# Patient Record
Sex: Male | Born: 1990 | Race: Black or African American | Hispanic: No | Marital: Single | Smoking: Current some day smoker
Health system: Southern US, Community
[De-identification: ages and names within clinical notes are randomized; demographics above are authoritative.]

## PROBLEM LIST (undated history)

## (undated) DIAGNOSIS — F209 Schizophrenia, unspecified: Secondary | ICD-10-CM

## (undated) DIAGNOSIS — F319 Bipolar disorder, unspecified: Secondary | ICD-10-CM

## (undated) DIAGNOSIS — E559 Vitamin D deficiency, unspecified: Secondary | ICD-10-CM

## (undated) DIAGNOSIS — J309 Allergic rhinitis, unspecified: Secondary | ICD-10-CM

## (undated) HISTORY — DX: Allergic rhinitis, unspecified: J30.9

## (undated) HISTORY — DX: Vitamin D deficiency, unspecified: E55.9

## (undated) HISTORY — PX: NO PAST SURGERIES: SHX2092

---
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
## 2016-03-28 ENCOUNTER — Emergency Department (HOSPITAL_COMMUNITY)
Admission: EM | Admit: 2016-03-28 | Discharge: 2016-03-28 | Disposition: A | Discharge status: Home / Self Care | Payer: Self-pay | Attending: Emergency Medicine | Admitting: Emergency Medicine

## 2016-03-28 ENCOUNTER — Encounter (HOSPITAL_COMMUNITY): Payer: Self-pay | Admitting: *Deleted

## 2016-03-28 DIAGNOSIS — F17210 Nicotine dependence, cigarettes, uncomplicated: Secondary | ICD-10-CM | POA: Insufficient documentation

## 2016-03-28 DIAGNOSIS — R51 Headache: Secondary | ICD-10-CM | POA: Insufficient documentation

## 2016-03-28 DIAGNOSIS — R519 Headache, unspecified: Secondary | ICD-10-CM

## 2016-03-28 LAB — CBG MONITORING, ED: Glucose-Capillary: 92 mg/dL (ref 65–99)

## 2016-03-28 MED ORDER — IBUPROFEN 800 MG PO TABS
800.0000 mg | ORAL_TABLET | Freq: Once | ORAL | Status: AC
  Administered 2016-03-28: 800 mg via ORAL
  Filled 2016-03-28: qty 1

## 2016-03-28 MED ORDER — IBUPROFEN 800 MG PO TABS: 800.0000 mg | ORAL_TABLET | Freq: Three times a day (TID) | ORAL | Status: DC

## 2016-03-28 MED ORDER — BUTALBITAL-APAP-CAFFEINE 50-325-40 MG PO TABS
1.0000 | ORAL_TABLET | Freq: Once | ORAL | Status: AC
  Administered 2016-03-28: 1 via ORAL
  Filled 2016-03-28: qty 1

## 2016-03-28 NOTE — ED Notes (Signed)
Pt reports h/a, nosebleed, and dizziness since yesterday.  Presents with a "lump" in back of his head, pt reports he was lying on concrete yesterday and "scraped" it.  Pt's family at bedside was shaking her head when pt was giving info about the lump on his head.  Denies any injury at this time.  Pt reports feeling dizzy when standing up.

## 2016-03-28 NOTE — ED Provider Notes (Signed)
CSN: [id number]     Arrival date & time 03/28/16  [id number] History   First MD Initiated Contact with Patient 03/28/16 0914     Chief Complaint  Patient presents with  . Headache     (Consider location/radiation/quality/duration/timing/severity/associated sxs/prior Treatment) Patient is a 25 y.o. male presenting with headaches. The history is provided by the patient.  Headache Pain location:  Occipital Quality:  Dull Radiates to:  Does not radiate Severity currently:  7/10 Severity at highest:  7/10 Onset quality:  Gradual Duration:  2 days Timing:  Constant Progression:  Worsening Chronicity:  New Similar to prior headaches: no   Relieved by:  Nothing Exacerbated by: palpation. Ineffective treatments:  None tried Associated symptoms: neck pain   Associated symptoms: no abdominal pain, no congestion, no diarrhea, no fever, no myalgias and no vomiting    25 yo M With a chief complaint of a headache. Patient states that this started a couple days ago when he rubs the back of his head against the concrete. He has a small hematoma there. Having some mild bilateral lateral neck pain. Denies any other injury. Feel that he has been a little bit lightheaded. Denies photophobia nausea vomiting.  History reviewed. No pertinent past medical history. History reviewed. No pertinent past surgical history. No family history on file. Social History  Substance Use Topics  . Smoking status: Current Every Day Smoker    Types: Cigarettes  . Smokeless tobacco: None  . Alcohol Use: Yes     Comment: occa    Review of Systems  Constitutional: Negative for fever and chills.  HENT: Negative for congestion and facial swelling.   Eyes: Negative for discharge and visual disturbance.  Respiratory: Negative for shortness of breath.   Cardiovascular: Negative for chest pain and palpitations.  Gastrointestinal: Negative for vomiting, abdominal pain and diarrhea.  Musculoskeletal: Positive for neck pain.  Negative for myalgias and arthralgias.  Skin: Negative for color change and rash.  Neurological: Positive for headaches. Negative for tremors and syncope.  Psychiatric/Behavioral: Negative for confusion and dysphoric mood.      Allergies  Review of patient's allergies indicates no known allergies.  Home Medications   Prior to Admission medications   Not on File   BP 109/78 mmHg  Pulse 72  Temp(Src) 98.2 F (36.8 C) (Oral)  Resp 17  Ht 5\' 5"  (1.651 m)  Wt 130 lb (58.968 kg)  BMI 21.63 kg/m2  SpO2 99% Physical Exam  Constitutional: He is oriented to person, place, and time. He appears well-developed and well-nourished.  HENT:  Head: Normocephalic and atraumatic.    Eyes: EOM are normal. Pupils are equal, round, and reactive to light.  Neck: Normal range of motion. Neck supple. No JVD present.  Cardiovascular: Normal rate and regular rhythm.  Exam reveals no gallop and no friction rub.   No murmur heard. Pulmonary/Chest: No respiratory distress. He has no wheezes.  Abdominal: He exhibits no distension. There is no rebound and no guarding.  Musculoskeletal: Normal range of motion.  Neurological: He is alert and oriented to person, place, and time. He has normal strength. No cranial nerve deficit or sensory deficit. He displays a negative Romberg sign. Coordination and gait normal. GCS eye subscore is 4. GCS verbal subscore is 5. GCS motor subscore is 6. He displays no Babinski's sign on the right side. He displays no Babinski's sign on the left side.  Reflex Scores:      Tricep reflexes are 2+ on the right side  and 2+ on the left side.      Bicep reflexes are 2+ on the right side and 2+ on the left side.      Brachioradialis reflexes are 2+ on the right side and 2+ on the left side.      Patellar reflexes are 2+ on the right side and 2+ on the left side.      Achilles reflexes are 2+ on the right side and 2+ on the left side. Benign neuro exam  Skin: No rash noted. No  pallor.  Psychiatric: He has a normal mood and affect. His behavior is normal.  Nursing note and vitals reviewed.   ED Course  Procedures (including critical care time) Labs Review Labs Reviewed  CBG MONITORING, ED    Imaging Review No results found. I have personally reviewed and evaluated these images and lab results as part of my medical decision-making.   EKG Interpretation None      MDM   Final diagnoses:  Nonintractable headache, unspecified chronicity pattern, unspecified headache type    25 yo M well-appearing and nontoxic. Benign neuro exam. Headache seems to be related to the hematoma. We'll have him treat with NSAIDs and Tylenol. PCP follow up.  10:07 AM:  I have discussed the diagnosis/risks/treatment options with the patient and family and believe the pt to be eligible for discharge home to follow-up with PCP. We also discussed returning to the ED immediately if new or worsening sx occur. We discussed the sx which are most concerning (e.g., sudden worsening pain, fever, stroke symptoms) that necessitate immediate return. Medications administered to the patient during their visit and any new prescriptions provided to the patient are listed below.  Medications given during this visit Medications  butalbital-acetaminophen-caffeine (FIORICET, ESGIC) 50-325-40 MG per tablet 1 tablet (1 tablet Oral Given 03/28/16 0944)  ibuprofen (ADVIL,MOTRIN) tablet 800 mg (800 mg Oral Given 03/28/16 0945)    New Prescriptions   No medications on file    The patient appears reasonably screen and/or stabilized for discharge and I doubt any other medical condition or other [facility] requiring further screening, evaluation, or treatment in the ED at this time prior to discharge.      Melene Plan, DO 03/28/16 1008

## 2016-03-28 NOTE — Discharge Instructions (Signed)
Take 4 over the counter ibuprofen tablets 3 times a day or 2 over-the-counter naproxen tablets twice a day for pain.  General Headache Without Cause A headache is pain or discomfort felt around the head or neck area. There are many causes and types of headaches. In some cases, the cause may not be found.  HOME CARE  Managing Pain  Take over-the-counter and prescription medicines only as told by your doctor.  Lie down in a dark, quiet room when you have a headache.  If directed, apply ice to the head and neck area:  Put ice in a plastic bag.  Place a towel between your skin and the bag.  Leave the ice on for 20 minutes, 2-3 times per day.  Use a heating pad or hot shower to apply heat to the head and neck area as told by your doctor.  Keep lights dim if bright lights bother you or make your headaches worse. Eating and Drinking  Eat meals on a regular schedule.  Lessen how much alcohol you drink.  Lessen how much caffeine you drink, or stop drinking caffeine. General Instructions  Keep all follow-up visits as told by your doctor. This is important.  Keep a journal to find out if certain things bring on headaches. For example, write down:  What you eat and drink.  How much sleep you get.  Any change to your diet or medicines.  Relax by getting a massage or doing other relaxing activities.  Lessen stress.  Sit up straight. Do not tighten (tense) your muscles.  Do not use tobacco products. This includes cigarettes, chewing tobacco, or e-cigarettes. If you need help quitting, ask your doctor.  Exercise regularly as told by your doctor.  Get enough sleep. This often means 7-9 hours of sleep. GET HELP IF:  Your symptoms are not helped by medicine.  You have a headache that feels different than the other headaches.  You feel sick to your stomach (nauseous) or you throw up (vomit).  You have a fever. GET HELP RIGHT AWAY IF:   Your headache becomes really  bad.  You keep throwing up.  You have a stiff neck.  You have trouble seeing.  You have trouble speaking.  You have pain in the eye or ear.  Your muscles are weak or you lose muscle control.  You lose your balance or have trouble walking.  You feel like you will pass out (faint) or you pass out.  You have confusion.   This information is not intended to replace advice given to you by your health care provider. Make sure you discuss any questions you have with your health care provider.   Document Released: 07/24/2008 Document Revised: 07/06/2015 Document Reviewed: 02/07/2015 Elsevier Interactive Patient Education [employer]2016 Elsevier Inc.

## 2016-03-29 DIAGNOSIS — Z139 Encounter for screening, unspecified: Secondary | ICD-10-CM

## 2016-04-10 NOTE — Congregational Nurse Program (Signed)
Congregational Nurse Program Note  Date of Encounter: 03/29/2016  Past Medical History: No past medical history on file.  Encounter Details:     CNP Questionnaire - 03/29/16 1423    Patient Demographics   Is this a new or existing patient? New   Patient is considered a/an Not Applicable   Race African-American/Black   Patient Assistance   Location of Patient Assistance Not Applicable   Patient's financial/insurance status Low Income;Self-Pay   Uninsured Patient Yes   Interventions Counseled to make appt. with provider   Patient referred to apply for the following financial assistance [employer]/Care Connects Renewal   Food insecurities addressed Provided food supplies   Transportation assistance No   Assistance securing medications No   Educational health offerings Hypertension;Navigating the healthcare system   Encounter Details   Primary purpose of visit Education/Health Concerns;Navigating the Healthcare System;Acute Illness/Condition Visit   Was an Emergency Department visit averted? Not Applicable   Does patient have a medical provider? No   Patient referred to Clinic   Was a mental health screening completed? (GAINS tool) No   Does patient have dental issues? No   Does patient have vision issues? No   Does your patient have an abnormal blood pressure today? No   Since previous encounter, have you referred patient for abnormal blood pressure that resulted in a new diagnosis or medication change? No   Does your patient have an abnormal blood glucose today? No   Since previous encounter, have you referred patient for abnormal blood glucose that resulted in a new diagnosis or medication change? No   Was there a life-saving intervention made? No       B/P check 110/70.  Was seen in the ED last for head ache.  Client does not have a provider.  Referred to Lavinia SharpsMary Ann Placey NP for follow up and [facility]

## 2017-03-14 ENCOUNTER — Ambulatory Visit: Payer: Self-pay | Admitting: Family Medicine

## 2017-03-15 ENCOUNTER — Emergency Department (HOSPITAL_COMMUNITY)
Admission: EM | Admit: 2017-03-15 | Discharge: 2017-03-15 | Disposition: A | Discharge status: Home / Self Care | Payer: Self-pay | Attending: Emergency Medicine | Admitting: Emergency Medicine

## 2017-03-15 DIAGNOSIS — F10920 Alcohol use, unspecified with intoxication, uncomplicated: Secondary | ICD-10-CM

## 2017-03-15 DIAGNOSIS — F17210 Nicotine dependence, cigarettes, uncomplicated: Secondary | ICD-10-CM | POA: Insufficient documentation

## 2017-03-15 DIAGNOSIS — Z5181 Encounter for therapeutic drug level monitoring: Secondary | ICD-10-CM | POA: Insufficient documentation

## 2017-03-15 DIAGNOSIS — F10120 Alcohol abuse with intoxication, uncomplicated: Secondary | ICD-10-CM | POA: Insufficient documentation

## 2017-03-15 LAB — COMPREHENSIVE METABOLIC PANEL
ALBUMIN: 4.6 g/dL (ref 3.5–5.0)
ALT: 46 U/L (ref 17–63)
AST: 132 U/L — ABNORMAL HIGH (ref 15–41)
Alkaline Phosphatase: 72 U/L (ref 38–126)
Anion gap: 13 (ref 5–15)
BUN: 20 mg/dL (ref 6–20)
CHLORIDE: 104 mmol/L (ref 101–111)
CO2: 19 mmol/L — AB (ref 22–32)
Calcium: 8.7 mg/dL — ABNORMAL LOW (ref 8.9–10.3)
Creatinine, Ser: 1.16 mg/dL (ref 0.61–1.24)
GFR calc Af Amer: >60 mL/min (ref >60)
GFR calc non Af Amer: >60 mL/min (ref >60)
Glucose, Bld: 71 mg/dL (ref 65–99)
POTASSIUM: 4.4 mmol/L (ref 3.5–5.1)
SODIUM: 136 mmol/L (ref 135–145)
Total Bilirubin: 0.5 mg/dL (ref 0.3–1.2)
Total Protein: 8.2 g/dL — ABNORMAL HIGH (ref 6.5–8.1)

## 2017-03-15 LAB — CBC
HCT: 45.7 % (ref 39.0–52.0)
HEMOGLOBIN: 14.9 g/dL (ref 13.0–17.0)
MCH: 31.7 pg (ref 26.0–34.0)
MCHC: 32.6 g/dL (ref 30.0–36.0)
MCV: 97.2 fL (ref 78.0–100.0)
Platelets: 227 10*3/uL (ref 150–400)
RBC: 4.7 MIL/uL (ref 4.22–5.81)
RDW: 13.1 % (ref 11.5–15.5)
WBC: 11.4 10*3/uL — ABNORMAL HIGH (ref 4.0–10.5)

## 2017-03-15 LAB — URINALYSIS, ROUTINE W REFLEX MICROSCOPIC
Bacteria, UA: NONE SEEN
Bilirubin Urine: NEGATIVE
Hgb urine dipstick: NEGATIVE
Ketones, ur: 20 mg/dL — AB
Leukocytes, UA: NEGATIVE
Nitrite: NEGATIVE
PROTEIN: NEGATIVE mg/dL
Specific Gravity, Urine: 1.014 (ref 1.005–1.030)
Squamous Epithelial / LPF: NONE SEEN
pH: 5 (ref 5.0–8.0)

## 2017-03-15 LAB — BLOOD GAS, VENOUS
Acid-base deficit: 4.5 mmol/L — ABNORMAL HIGH (ref 0.0–2.0)
Bicarbonate: 21.2 mmol/L (ref 20.0–28.0)
O2 Saturation: 83.4 %
PCO2 VEN: 43.6 mmHg — AB (ref 44.0–60.0)
PO2 VEN: 51.9 mmHg — AB (ref 32.0–45.0)
Patient temperature: 98.6
pH, Ven: 7.308 (ref 7.250–7.430)

## 2017-03-15 LAB — RAPID URINE DRUG SCREEN, HOSP PERFORMED
AMPHETAMINES: NOT DETECTED
Barbiturates: NOT DETECTED
Benzodiazepines: NOT DETECTED
Cocaine: NOT DETECTED
OPIATES: NOT DETECTED
TETRAHYDROCANNABINOL: POSITIVE — AB

## 2017-03-15 LAB — CBG MONITORING, ED
GLUCOSE-CAPILLARY: 98 mg/dL (ref 65–99)
Glucose-Capillary: 62 mg/dL — ABNORMAL LOW (ref 65–99)

## 2017-03-15 LAB — ETHANOL: Alcohol, Ethyl (B): 70 mg/dL — ABNORMAL HIGH (ref <5)

## 2017-03-15 LAB — LIPASE, BLOOD: LIPASE: 25 U/L (ref 11–51)

## 2017-03-15 LAB — OSMOLALITY: OSMOLALITY: 300 mosm/kg — AB (ref 275–295)

## 2017-03-15 MED ORDER — ONDANSETRON HCL 4 MG/2ML IJ SOLN
4.0000 mg | Freq: Once | INTRAMUSCULAR | Status: AC
  Administered 2017-03-15: 4 mg via INTRAVENOUS
  Filled 2017-03-15: qty 2

## 2017-03-15 MED ORDER — DEXTROSE 50 % IV SOLN
1.0000 | Freq: Once | INTRAVENOUS | Status: AC
  Administered 2017-03-15: 50 mL via INTRAVENOUS
  Filled 2017-03-15: qty 50

## 2017-03-15 NOTE — ED Triage Notes (Signed)
Per EMS, [employer] called out due to patient sleeping in parking lot-EMS obtained a CBG 61-was given a coke and CBG came out to 71-patient complaining of being cold and thirsty-per EMS, drugs were found on him

## 2017-03-15 NOTE — ED Notes (Signed)
PATIENT GIVEN A [place] SANDWHICH ALONG WITH ORANGE JUICE AND GRAM CRACKERS

## 2017-03-15 NOTE — ED Notes (Signed)
Vitals by EMS - hr 55, 128/84, resp 18, 100% ra

## 2017-03-15 NOTE — ED Provider Notes (Addendum)
[facility] DEPT Provider Note   CSN: [id number] Arrival date & time: 03/15/17  1028     History   Chief Complaint Chief Complaint  Patient presents with  . abnormal CBG    HPI William Mayer is a 26 y.o. male.  HPI  Level V caveat secondary to patient's noncooperation or inability to give history This Is a 26 year old man who was found in a parking lot not responsive. They report that he was given Coke at the scene CBG 71. He then was complaining of being cold 30 per EMS. After he arrived here he was given something to eat and drink and immediately vomited. He currently states he has some abdominal pain. His answers some questions of mumbling yes and no other questions he does not reply 2.  No past medical history on file.  There are no active problems to display for this patient.   No past surgical history on file.     Home Medications    Prior to Admission medications   Medication Sig Start Date End Date Taking? Authorizing Provider  ibuprofen (ADVIL,MOTRIN) 800 MG tablet Take 1 tablet (800 mg total) by mouth 3 (three) times daily. 03/28/16   Deno Etienne, DO    Family History No family history on file.  Social History Social History  Substance Use Topics  . Smoking status: Current Every Day Smoker    Types: Cigarettes  . Smokeless tobacco: Not on file  . Alcohol use Yes     Comment: occa     Allergies   Patient has no known allergies.   Review of Systems Review of Systems  All other systems reviewed and are negative.    Physical Exam Updated Vital Signs BP 115/76 (BP Location: Right Arm)   Pulse (!) 52   Resp 16   SpO2 100%   Physical Exam  Constitutional: He is oriented to person, place, and time. He appears well-developed and well-nourished.  HENT:  Head: Normocephalic and atraumatic.  Eyes: EOM are normal. Pupils are equal, round, and reactive to light.  Neck: Normal range of motion. Neck supple.  Cardiovascular: Normal rate, regular  rhythm and normal heart sounds.   Pulmonary/Chest: Effort normal and breath sounds normal.  Abdominal: Soft. Bowel sounds are normal.  Musculoskeletal: Normal range of motion.  Neurological: He is alert and oriented to person, place, and time. He displays normal reflexes. No cranial nerve deficit or sensory deficit. He exhibits normal muscle tone. Coordination normal.  Skin: Skin is warm and dry.  Psychiatric: His speech is normal. Judgment and thought content normal. He is slowed. Cognition and memory are normal.  Nursing note and vitals reviewed.    ED Treatments / Results  Labs (all labs ordered are listed, but only abnormal results are displayed) Labs Reviewed  CBG MONITORING, ED - Abnormal; Notable for the following:       Result Value   Glucose-Capillary 62 (*)    All other components within normal limits  COMPREHENSIVE METABOLIC PANEL  [name]  CBC  RAPID URINE DRUG SCREEN, HOSP PERFORMED  LIPASE, BLOOD  URINALYSIS, ROUTINE W REFLEX MICROSCOPIC    EKG  EKG Interpretation None       Radiology No results found.  Procedures Procedures (including critical care time)  Medications Ordered in ED Medications - No data to display   Initial Impression / Assessment and Plan / ED Course  I have reviewed the triage vital signs and the nursing notes.  Pertinent labs & imaging results  that were available during my care of the patient were reviewed by me and considered in my medical decision making (see chart for details).  Clinical Course as of Mar 16 1335  Fri Mar 15, 2017  1333 AST elevated-lipase and alt and alk phos normal  AST: (!) 132 [DR]    Clinical Course User Index [DR] Pattricia Boss, MD    1:32 PM Patient requesting food.  Zofran ordered.  Will trial clear liquids DDX- toxic alcohol ingestion-Normal VBG. Serum osmolality pending. 4:41 PM Serum osmolality is 295 which is only slightly elevated. In the interim, patient has been up and Ambulating without  difficulty. I doubt that he was ever significantly hypoglycemic with 70 normal for somebody who is fasting. His CBGs have been normal here in the ED and he is tolerating by mouth without difficulty. His EtOH level was 70, I suspect that he was more intoxicated earlier today. He now has a normal neurological exam and is tolerating fluids without difficulty. He is discharged home in improved condition. Final Clinical Impressions(s) / ED Diagnoses   Final diagnoses:  Alcoholic intoxication without complication [facility])    New Prescriptions New Prescriptions   No medications on file     Pattricia Boss, MD 03/15/17 [place]    Pattricia Boss, MD 03/15/17 [phone]

## 2017-03-15 NOTE — ED Notes (Signed)
Pt was given a coke in route by EMS and he drank some OJ. Pt vomited liquids back up. Has not had sandwich yet.

## 2017-03-23 ENCOUNTER — Emergency Department (HOSPITAL_COMMUNITY)
Admission: EM | Admit: 2017-03-23 | Discharge: 2017-03-27 | Disposition: A | Discharge status: Home / Self Care | Payer: Self-pay | Attending: Emergency Medicine | Admitting: Emergency Medicine

## 2017-03-23 ENCOUNTER — Encounter (HOSPITAL_COMMUNITY): Payer: Self-pay | Admitting: Emergency Medicine

## 2017-03-23 DIAGNOSIS — F17210 Nicotine dependence, cigarettes, uncomplicated: Secondary | ICD-10-CM | POA: Insufficient documentation

## 2017-03-23 DIAGNOSIS — Z79899 Other long term (current) drug therapy: Secondary | ICD-10-CM | POA: Insufficient documentation

## 2017-03-23 DIAGNOSIS — F39 Unspecified mood [affective] disorder: Secondary | ICD-10-CM | POA: Diagnosis present

## 2017-03-23 DIAGNOSIS — R4689 Other symptoms and signs involving appearance and behavior: Secondary | ICD-10-CM

## 2017-03-23 LAB — COMPREHENSIVE METABOLIC PANEL
ALK PHOS: 71 U/L (ref 38–126)
ALT: 16 U/L — AB (ref 17–63)
AST: 24 U/L (ref 15–41)
Albumin: 3.7 g/dL (ref 3.5–5.0)
Anion gap: 9 (ref 5–15)
BILIRUBIN TOTAL: 0.4 mg/dL (ref 0.3–1.2)
BUN: 11 mg/dL (ref 6–20)
CALCIUM: 9.3 mg/dL (ref 8.9–10.3)
CO2: 23 mmol/L (ref 22–32)
CREATININE: 0.9 mg/dL (ref 0.61–1.24)
Chloride: 108 mmol/L (ref 101–111)
Glucose, Bld: 133 mg/dL — ABNORMAL HIGH (ref 65–99)
Potassium: 3.9 mmol/L (ref 3.5–5.1)
Sodium: 140 mmol/L (ref 135–145)
Total Protein: 7.1 g/dL (ref 6.5–8.1)

## 2017-03-23 LAB — CBC WITH DIFFERENTIAL/PLATELET
Basophils Absolute: 0 10*3/uL (ref 0.0–0.1)
Basophils Relative: 0 %
Eosinophils Absolute: 0.1 10*3/uL (ref 0.0–0.7)
Eosinophils Relative: 2 %
HEMATOCRIT: 41.4 % (ref 39.0–52.0)
HEMOGLOBIN: 13.6 g/dL (ref 13.0–17.0)
LYMPHS ABS: 2 10*3/uL (ref 0.7–4.0)
Lymphocytes Relative: 34 %
MCH: 31.6 pg (ref 26.0–34.0)
MCHC: 32.9 g/dL (ref 30.0–36.0)
MCV: 96.1 fL (ref 78.0–100.0)
Monocytes Absolute: 0.3 10*3/uL (ref 0.1–1.0)
Monocytes Relative: 5 %
NEUTROS PCT: 59 %
Neutro Abs: 3.5 10*3/uL (ref 1.7–7.7)
Platelets: 244 10*3/uL (ref 150–400)
RBC: 4.31 MIL/uL (ref 4.22–5.81)
RDW: 13.5 % (ref 11.5–15.5)
WBC: 5.9 10*3/uL (ref 4.0–10.5)

## 2017-03-23 LAB — RAPID URINE DRUG SCREEN, HOSP PERFORMED
Amphetamines: NOT DETECTED
Barbiturates: NOT DETECTED
Benzodiazepines: NOT DETECTED
Cocaine: NOT DETECTED
OPIATES: NOT DETECTED
TETRAHYDROCANNABINOL: POSITIVE — AB

## 2017-03-23 LAB — ETHANOL: Alcohol, Ethyl (B): <5 mg/dL (ref <5)

## 2017-03-23 MED ORDER — IBUPROFEN 200 MG PO TABS: 600.0000 mg | ORAL_TABLET | Freq: Three times a day (TID) | ORAL | Status: DC | PRN

## 2017-03-23 MED ORDER — ACETAMINOPHEN 325 MG PO TABS: 650.0000 mg | ORAL_TABLET | ORAL | Status: DC | PRN

## 2017-03-23 MED ORDER — ONDANSETRON HCL 4 MG PO TABS: 4.0000 mg | ORAL_TABLET | Freq: Three times a day (TID) | ORAL | Status: DC | PRN

## 2017-03-23 MED ORDER — ALUM & MAG HYDROXIDE-SIMETH 200-200-20 MG/5ML PO SUSP: 30.0000 mL | Freq: Four times a day (QID) | ORAL | Status: DC | PRN

## 2017-03-23 MED ORDER — NICOTINE 21 MG/24HR TD PT24
21.0000 mg | MEDICATED_PATCH | Freq: Once | TRANSDERMAL | Status: AC
  Administered 2017-03-23: 21 mg via TRANSDERMAL
  Filled 2017-03-23: qty 1

## 2017-03-23 NOTE — ED Notes (Signed)
Pt provided with decaf coffee per request.  

## 2017-03-23 NOTE — ED Notes (Signed)
In room eating crackers.  Pt is wanting to leave.  Pt is aware that he is under IVC and can not leave.  Pt reports that he does not understand, that he did not do anything wrong.  Attempted to explain IVC to pt, pt did not seem to understand. Will ask TTS to talk with pt.

## 2017-03-23 NOTE — ED Notes (Signed)
SBAR Report received from previous nurse. Pt received calm and visible on unit. Pt ignored writers questions related to current SI/ HI, A/V H, depression, anxiety, or pain at this time, but appears otherwise stable and free of distress. Pt reminded of camera surveillance, q 15 min rounds, and rules of the milieu. Will continue to assess.

## 2017-03-23 NOTE — ED Notes (Signed)
Pt ambulatory w/o difficulty to room 40 

## 2017-03-23 NOTE — ED Provider Notes (Signed)
[facility]-EMERGENCY DEPT Provider Note   CSN: [id number] Arrival date & time: 03/23/17  1043     History   Chief Complaint Chief Complaint  Patient presents with  . Aggressive Behavior  . Medical Clearance    HPI William Mayer is a 26 y.o. male.  HPI William Mayer is a 26 y.o. male presents to emergency department for [facility] facility for aggressive behavior. Patient was involuntarily committed for being aggressive towards his family member, Child psychotherapist, for not sleeping, not keeping up with his hygiene, hallucinating. Apparently while at [facility] patient became uncooperative and was sent here for further treatment. Patient is telling me that he does not know why he is here. He states that "someone called the cops on me." He stated that he does not know what happened and [place] he was transferred here. He denies any complaints at this time.  History reviewed. No pertinent past medical history.  There are no active problems to display for this patient.   History reviewed. No pertinent surgical history.     Home Medications    Prior to Admission medications   Medication Sig Start Date End Date Taking? Authorizing Provider  ibuprofen (ADVIL,MOTRIN) 800 MG tablet Take 1 tablet (800 mg total) by mouth 3 (three) times daily. Patient not taking: Reported on 03/15/2017 03/28/16   Melene Plan, DO    Family History No family history on file.  Social History Social History  Substance Use Topics  . Smoking status: Current Every Day Smoker    Types: Cigarettes  . Smokeless tobacco: Never Used  . Alcohol use Yes     Comment: occa     Allergies   Patient has no known allergies.   Review of Systems Review of Systems  Constitutional: Negative for chills and fever.  Respiratory: Negative for cough, chest tightness and shortness of breath.   Cardiovascular: Negative for chest pain, palpitations and leg swelling.  Gastrointestinal: Negative for abdominal distention, abdominal  pain, diarrhea, nausea and vomiting.  Musculoskeletal: Negative for arthralgias, myalgias, neck pain and neck stiffness.  Skin: Negative for rash.  Allergic/Immunologic: Negative for immunocompromised state.  Neurological: Negative for dizziness, weakness, light-headedness, numbness and headaches.  Psychiatric/Behavioral: Positive for behavioral problems and hallucinations.  All other systems reviewed and are negative.    Physical Exam Updated Vital Signs There were no vitals taken for this visit.  Physical Exam  Constitutional: He appears well-developed and well-nourished. No distress.  HENT:  Head: Normocephalic and atraumatic.  Eyes: Conjunctivae are normal.  Neck: Neck supple.  Cardiovascular: Normal rate, regular rhythm and normal heart sounds.   Pulmonary/Chest: Effort normal. No respiratory distress. He has no wheezes. He has no rales.  Abdominal: Soft. Bowel sounds are normal. He exhibits no distension. There is no tenderness. There is no rebound.  Musculoskeletal: He exhibits no edema.  Neurological: He is alert.  Skin: Skin is warm and dry.  Psychiatric:  Speaking softly, avoids eye contact.  Nursing note and vitals reviewed.    ED Treatments / Results  Labs (all labs ordered are listed, but only abnormal results are displayed) Labs Reviewed  COMPREHENSIVE METABOLIC PANEL - Abnormal; Notable for the following:       Result Value   Glucose, Bld 133 (*)    ALT 16 (*)    All other components within normal limits  CBC WITH DIFFERENTIAL/PLATELET  [name]  RAPID URINE DRUG SCREEN, HOSP PERFORMED    EKG  EKG Interpretation None  Radiology No results found.  Procedures Procedures (including critical care time)  Medications Ordered in ED Medications  nicotine (NICODERM CQ - dosed in mg/24 hours) patch 21 mg (21 mg Transdermal Patch Applied 03/23/17 1132)     Initial Impression / Assessment and Plan / ED Course  I have reviewed the triage vital  signs and the nursing notes.  Pertinent labs & imaging results that were available during my care of the patient were reviewed by me and considered in my medical decision making (see chart for details).     Patient in emergency department after being involuntary commited. Will get medical clearance labs and TTS involvement.   Patient is medically cleared. He remains calm and cooperative.  Final Clinical Impressions(s) / ED Diagnoses   Final diagnoses:  None    New Prescriptions New Prescriptions   No medications on file     Jaynie CrumbleKirichenko, Litzy Dicker, Cordelia Poche-C 03/25/17 1036    Shaune PollackIsaacs, Cameron, MD 03/26/17 [phone]

## 2017-03-23 NOTE — BH Assessment (Addendum)
Assessment Note  William Mayer is an 26 y.o. male that presents this date under IVC. Per IVC: "The petitioner states the respondent has not been eating, sleeping and not taking care of personal hygiene. Respondent spent 7 years in jail and his behavior has not been the same since he was released. Respondent sleeps a lot, gets angry quickly and this date became very angry at a Child psychotherapistsocial worker at [facility] and walked out. Respondent seems to be responding to internal stimuli and laughs uncontrollably for hours. Patient appears to be responding to stimuli that is not present at times. Last night he threatened grandmother with bodily harm and has been sleeping outside and has been talking to himself. His behavior has become very aggressive." Patient would not uncover his head or respond to any of this writer's questions. This writer attempted multiple times to try and interact with patient unsuccessfully. Information for purposes of assessment was obtained from admission notes and IVC. This writer attempt to contact patient's mother William Mayer [phone] from information obtained from IVC unsuccessfully to gather collateral information. Patient did test positive for THC this date. Per notes, patient [facility] [employer] after being aggressive with staff. Patient reported to not be eating, sleeping or taking care of personal hygiene. Patient tells this Clinical research associatewriter that he doesn't know why he is here. Patient has flat affect and does not make eye contact with this Clinical research associatewriter. [employer] at bedside. Patient arrives with IVC papers and remains handcuffed.Case was staffed with Rankin NP who recommended a inpatient admission as appropriate bed placement is investigated.    Diagnosis: Unspecified psychosis, Cannabis use  Past Medical History: History reviewed. No pertinent past medical history.  History reviewed. No pertinent surgical history.  Family History: No family history on file.  Social History:  reports that he has  been smoking Cigarettes.  He has never used smokeless tobacco. He reports that he drinks alcohol. He reports that he uses drugs, including Marijuana.  Additional Social History:  Alcohol / Drug Use Pain Medications: See MAR Prescriptions: See MAR Over the Counter: See MAR History of alcohol / drug use?: Yes Longest period of sobriety (when/how long):  (UTA) Negative Consequences of Use:  (UTA) Withdrawal Symptoms:  (UTA) Substance #1 Name of Substance 1: Cannabis (positive on admission) 1 - Age of First Use: Unknown 1 - Amount (size/oz): Unknown 1 - Frequency: Unknown 1 - Duration: Unknown 1 - Last Use / Amount: Unknown  CIWA: CIWA-Ar BP: (!) 143/63 Pulse Rate: 63 COWS:    Allergies: No Known Allergies  Home Medications:  (Not in a hospital admission)  OB/GYN Status:  No LMP for male patient.  General Assessment Data Location of Assessment: [facility] TTS Assessment: In system Is this a Tele or Face-to-Face Assessment?: Face-to-Face Is this an Initial Assessment or a Re-assessment for this encounter?: Initial Assessment Marital status: Single Maiden name: NA Is patient pregnant?: No Pregnancy Status: No Living Arrangements: Parent (Per IVC) Can pt return to current living arrangement?:  (Unknown) Admission Status: Involuntary Is patient capable of signing voluntary admission?: No Referral Source: Other Regulatory affairs officer(Parent) Insurance type: Self Pay  Medical Screening Exam [facility] Walk[facility]in ONLY) Medical Exam completed: Yes  Crisis Care Plan Living Arrangements: Parent (Per IVC) Legal Guardian:  (NA) Name of Psychiatrist: UTA Name of Therapist: UTA  Education Status Is patient currently in school?: No Current Grade:  (NA) Highest grade of school patient has completed:  (UTA) Name of school:  (NA) Contact person:  (NA)  Risk  to self with the past 6 months Suicidal Ideation:  (UTA) Has patient been a risk to self within the past 6 months prior to admission? :  (UTA) Suicidal  Intent:  (UTA) Has patient had any suicidal intent within the past 6 months prior to admission? :  (UTA) Is patient at risk for suicide?:  (UTA) Suicidal Plan?:  (UTA) Has patient had any suicidal plan within the past 6 months prior to admission? :  (UTA) Access to Means:  (UTA) What has been your use of drugs/alcohol within the last 12 months?: Current use pt post for [facility] Previous Attempts/Gestures:  (UTA) How many times?:  (UTA) Other Self Harm Risks:  (UTA) Triggers for Past Attempts: Unknown Intentional Self Injurious Behavior:  (UTA) Family Suicide History:  (UTA) Recent stressful life event(s):  (UTA) Persecutory voices/beliefs?:  Rich Reining) Depression:  (UTA) Depression Symptoms:  (UTA) Substance abuse history and/or treatment for substance abuse?:  (UTA) Suicide prevention information given to non-admitted patients:  (UTA)  Risk to Others within the past 6 months Homicidal Ideation:  (UTA) Does patient have any lifetime risk of violence toward others beyond the six months prior to admission? :  (UTA) Thoughts of Harm to Others:  (threats to family per IVC) Current Homicidal Intent:  (UTA) Current Homicidal Plan:  (UTA) Access to Homicidal Means:  (UTA) Identified Victim:  (UTA) History of harm to others?: Yes Assessment of Violence: On admission Violent Behavior Description: threats to family per IVC Does patient have access to weapons?:  (UTA) Criminal Charges Pending?:  (UTA) Does patient have a court date:  (UTA) Is patient on probation?:  (UTA)  Psychosis Hallucinations: Auditory, Visual (Per IVC) Delusions:  (UTA)  Mental Status Report Appearance/Hygiene: In scrubs Eye Contact: Unable to Assess Motor Activity: Unable to assess Speech: Unable to assess Level of Consciousness: Unable to assess Mood:  (UTA) Affect: Unable to Assess Anxiety Level:  (UTA) Thought Processes: Unable to Assess Judgement: Unable to Assess Orientation: Unable to assess Obsessive  Compulsive Thoughts/Behaviors: Unable to Assess  Cognitive Functioning Concentration: Unable to Assess Memory: Unable to Assess IQ:  (UTA) Insight:  (UTA) Impulse Control:  (UTA) Appetite:  (UTA) Weight Loss:  (UTA) Weight Gain:  (UTA) Sleep:  (UTA) Total Hours of Sleep:  (UTA) Vegetative Symptoms:  (UTA)  ADLScreening [facility] Assessment Services) Patient's cognitive ability adequate to safely complete daily activities?: Yes Patient able to express need for assistance with ADLs?: Yes Independently performs ADLs?: Yes (appropriate for developmental age)  Prior Inpatient Therapy Prior Inpatient Therapy:  (UTA) Prior Therapy Dates:  (UTA) Prior Therapy Facilty/Provider(s):  (UTA) Reason for Treatment:  (UTA)  Prior Outpatient Therapy Prior Outpatient Therapy:  (UTA) Prior Therapy Dates:  (UTA) Prior Therapy Facilty/Provider(s):  (UTA) Reason for Treatment:  (UTA) Does patient have an ACCT team?: Unknown Does patient have Intensive In-House Services?  : Unknown Does patient have [facility] services? : Yes (Per IVC) Does patient have P4CC services?: Unknown  ADL Screening (condition at time of admission) Patient's cognitive ability adequate to safely complete daily activities?: Yes Is the patient deaf or have difficulty hearing?: No Does the patient have difficulty seeing, even when wearing glasses/contacts?: No Does the patient have difficulty concentrating, remembering, or making decisions?: Yes (Per IVC) Patient able to express need for assistance with ADLs?: Yes Does the patient have difficulty dressing or bathing?: No (pt per IVC has been refusing to take care of hygiene) Independently performs ADLs?: Yes (appropriate for developmental age) Does the patient have difficulty walking  or climbing stairs?: No Weakness of Legs: None Weakness of Arms/Hands: None  Home Assistive Devices/Equipment Home Assistive Devices/Equipment: None  Therapy Consults (therapy consults require a  physician order) PT Evaluation Needed: No OT Evalulation Needed: No SLP Evaluation Needed: No Abuse/Neglect Assessment (Assessment to be complete while patient is alone) Physical Abuse: Denies Verbal Abuse: Denies Sexual Abuse: Denies Exploitation of patient/patient's resources: Denies Self-Neglect: Denies Values / Beliefs Cultural Requests During Hospitalization: None Spiritual Requests During Hospitalization: None Consults Spiritual Care Consult Needed: No Social Work Consult Needed: No Merchant navy officer (For Healthcare) Does Patient Have a Medical Advance Directive?: No Would patient like information on creating a medical advance directive?: No - Patient declined    Additional Information 1:1 In Past 12 Months?: No CIRT Risk: No Elopement Risk: No Does patient have medical clearance?: Yes     Disposition: Case was staffed with Rankin NP who recommended a inpatient admission as appropriate bed placement is investigated.   Disposition Initial Assessment Completed for this Encounter: Yes Disposition of Patient: Inpatient treatment program Type of inpatient treatment program: Adult  On Site Evaluation by:   Reviewed with Physician:    Alfredia Ferguson 03/23/2017 2:46 PM

## 2017-03-23 NOTE — ED Notes (Signed)
Up to the bathroom 

## 2017-03-23 NOTE — BH Assessment (Signed)
[facility] Assessment Progress Note Case was staffed with Rankin NP who recommended a inpatient admission as appropriate bed placement is investigated.       

## 2017-03-23 NOTE — ED Notes (Signed)
[facility] Expected date:  Expected time:  Means of arrival:  Comments: [facility]

## 2017-03-23 NOTE — ED Notes (Signed)
[facility] Expected date:  Expected time:  Means of arrival:  Comments: held

## 2017-03-23 NOTE — ED Notes (Signed)
Security wanded pt ?

## 2017-03-23 NOTE — ED Triage Notes (Signed)
[facility] after being aggressive with staff. Pt reported to not be eating, sleeping or taking care of personal hygiene. Pt tells this Clinical research associatewriter that he doesn't know why he is here. Pt sts his L arm hurts from falling last week after drinking [employer] and blacking out. Pt has flat affect and does not make eye contact with this Clinical research associatewriter. [employer] at bedside. Pt arrives with IVC papers and remains handcuffed. Pt in NAD

## 2017-03-24 DIAGNOSIS — F39 Unspecified mood [affective] disorder: Secondary | ICD-10-CM | POA: Diagnosis present

## 2017-03-24 MED ORDER — OLANZAPINE 5 MG PO TBDP
5.0000 mg | ORAL_TABLET | Freq: Three times a day (TID) | ORAL | Status: DC | PRN
  Filled 2017-03-24: qty 1

## 2017-03-24 MED ORDER — OLANZAPINE 5 MG PO TBDP
5.0000 mg | ORAL_TABLET | Freq: Three times a day (TID) | ORAL | Status: DC
  Administered 2017-03-24 – 2017-03-25 (×2): 5 mg via ORAL
  Filled 2017-03-24: qty 1

## 2017-03-24 NOTE — Consult Note (Addendum)
[facility] Face-to-Face Psychiatry Consult   Reason for Consult:   Referring Physician:  EDP Patient Identification: William Mayer MRN:  [id number] Principal Diagnosis: Psychotic affective disorder (HCC) Diagnosis:   Patient Active Problem List   Diagnosis Date Noted  . Psychotic affective disorder (HCC) [F39] 03/24/2017     Total Time spent with patient: 30 minutes  Subjective:   William Mayer is a 26 y.o. male with no known psychiatry hisotry, who was IVC'd at [facility] for being aggressive towards his family member, social worker, and he was reportedly not taking care of himself and has hallucination.   HPI:   Patient is not cooperative to the interview and he provides no answer but occasionally cursed to the team.  Collateral is obtained from patient's mother (Monica Warner [phone]) by Kimberly Long, LCSWA "Patient's mother reported that patient has a history of making threats and that he frequently has outbursts. She reported that when patient has an outburst he gets angry and starts saying outlandish stuff. Patient's mother reported that patient randomly bursts into laughter when no one is speaking to him and starts mumbling to himself. She reported that patient has seen a psychiatrist once at the end of last year and that patient has not been on any medications in the past. Patient's mother reported that patient has been increasingly paranoid since being released from prison and that patient spent a lot of time in solitary confinement while in prison. Patient's mother reported that she feels something is wrong with patient and that he needs help."  Past Psychiatric History:  No known psychiatry history  Risk to Self: Suicidal Ideation:  (UTA) Suicidal Intent:  (UTA) Is patient at risk for suicide?:  (UTA) Suicidal Plan?:  (UTA) Access to Means:  (UTA) What has been your use of drugs/alcohol within the last 12 months?: Current use pt post for THC How many times?:  (UTA) Other  Self Harm Risks:  (UTA) Triggers for Past Attempts: Unknown Intentional Self Injurious Behavior:  (UTA) Risk to Others: Homicidal Ideation:  (UTA) Thoughts of Harm to Others:  (threats to family per IVC) Current Homicidal Intent:  (UTA) Current Homicidal Plan:  (UTA) Access to Homicidal Means:  (UTA) Identified Victim:  (UTA) History of harm to others?: Yes Assessment of Violence: On admission Violent Behavior Description: threats to family per IVC Does patient have access to weapons?:  (UTA) Criminal Charges Pending?:  (UTA) Does patient have a court date:  (UTA) Prior Inpatient Therapy: Prior Inpatient Therapy:  (UTA) Prior Therapy Dates:  (UTA) Prior Therapy Facilty/Provider(s):  (UTA) Reason for Treatment:  (UTA) Prior Outpatient Therapy: Prior Outpatient Therapy:  (UTA) Prior Therapy Dates:  (UTA) Prior Therapy Facilty/Provider(s):  (UTA) Reason for Treatment:  (UTA) Does patient have an ACCT team?: Unknown Does patient have Intensive In-House Services?  : Unknown Does patient have [facility] services? : Yes (Per IVC) Does patient have P4CC services?: Unknown  Past Medical History: History reviewed. No pertinent past medical history. History reviewed. No pertinent surgical history. Family History: No family history on file. Family Psychiatric  History: unable to obtain Social History:  History  Alcohol Use  . Yes    Comment: occa     History  Drug Use  . Types: Marijuana    Comment: "sometimes"    Social History   Social History  . Marital status: Single    Spouse name: N/A  . Number of children: N/A  . Years of education: N/A   Social History Main Topics  .   Smoking status: Current Every Day Smoker    Types: Cigarettes  . Smokeless tobacco: Never Used  . Alcohol use Yes     Comment: occa  . Drug use: Yes    Types: Marijuana     Comment: "sometimes"  . Sexual activity: Not Asked   Other Topics Concern  . None   Social History Narrative  . None    Additional Social History:    Allergies:  No Known Allergies  Labs:  Results for orders placed or performed during the hospital encounter of 03/23/17 (from the past 48 hour(s))  CBC with Differential     Status: None   Collection Time: 03/23/17 11:06 AM  Result Value Ref Range   WBC 5.9 4.0 - 10.5 K/uL   RBC 4.31 4.22 - 5.81 MIL/uL   Hemoglobin 13.6 13.0 - 17.0 g/dL   HCT 41.4 39.0 - 52.0 %   MCV 96.1 78.0 - 100.0 fL   MCH 31.6 26.0 - 34.0 pg   MCHC 32.9 30.0 - 36.0 g/dL   RDW 13.5 11.5 - 15.5 %   Platelets 244 150 - 400 K/uL   Neutrophils Relative % 59 %   Neutro Abs 3.5 1.7 - 7.7 K/uL   Lymphocytes Relative 34 %   Lymphs Abs 2.0 0.7 - 4.0 K/uL   Monocytes Relative 5 %   Monocytes Absolute 0.3 0.1 - 1.0 K/uL   Eosinophils Relative 2 %   Eosinophils Absolute 0.1 0.0 - 0.7 K/uL   Basophils Relative 0 %   Basophils Absolute 0.0 0.0 - 0.1 K/uL  Comprehensive metabolic panel     Status: Abnormal   Collection Time: 03/23/17 11:06 AM  Result Value Ref Range   Sodium 140 135 - 145 mmol/L   Potassium 3.9 3.5 - 5.1 mmol/L   Chloride 108 101 - 111 mmol/L   CO2 23 22 - 32 mmol/L   Glucose, Bld 133 (H) 65 - 99 mg/dL   BUN 11 6 - 20 mg/dL   Creatinine, Ser 0.90 0.61 - 1.24 mg/dL   Calcium 9.3 8.9 - 10.3 mg/dL   Total Protein 7.1 6.5 - 8.1 g/dL   Albumin 3.7 3.5 - 5.0 g/dL   AST 24 15 - 41 U/L   ALT 16 (L) 17 - 63 U/L   Alkaline Phosphatase 71 38 - 126 U/L   Total Bilirubin 0.4 0.3 - 1.2 mg/dL   GFR calc non Af Amer >60 >60 mL/min   GFR calc Af Amer >60 >60 mL/min    Comment: (NOTE) The eGFR has been calculated using the CKD EPI equation. This calculation has not been validated in all clinical situations. eGFR's persistently <60 mL/min signify possible Chronic Kidney Disease.    Anion gap 9 5 - 15  [name]     Status: None   Collection Time: 03/23/17 11:06 AM  Result Value Ref Range   Alcohol, Ethyl (B) <5 <5 mg/dL    Comment:        LOWEST DETECTABLE LIMIT  FOR SERUM ALCOHOL IS 5 mg/dL FOR MEDICAL PURPOSES ONLY   Rapid urine drug screen (hospital performed)     Status: Abnormal   Collection Time: 03/23/17 11:06 AM  Result Value Ref Range   Opiates NONE DETECTED NONE DETECTED   Cocaine NONE DETECTED NONE DETECTED   Benzodiazepines NONE DETECTED NONE DETECTED   Amphetamines NONE DETECTED NONE DETECTED   Tetrahydrocannabinol POSITIVE (A) NONE DETECTED   Barbiturates NONE DETECTED NONE DETECTED    Comment:          DRUG SCREEN FOR MEDICAL PURPOSES ONLY.  IF CONFIRMATION IS NEEDED FOR ANY PURPOSE, NOTIFY LAB WITHIN 5 DAYS.        LOWEST DETECTABLE LIMITS FOR URINE DRUG SCREEN Drug Class       Cutoff (ng/mL) Amphetamine      1000 Barbiturate      200 Benzodiazepine   121 Tricyclics       975 Opiates          300 Cocaine          300 THC              50     Current Facility-Administered Medications  Medication Dose Route Frequency Provider Last Rate Last Dose  . acetaminophen (TYLENOL) tablet 650 mg  650 mg Oral Q4H PRN Kirichenko, Tatyana, PA-C      . alum & mag hydroxide-simeth (MAALOX/MYLANTA) 200-200-20 MG/5ML suspension 30 mL  30 mL Oral Q6H PRN Kirichenko, Tatyana, PA-C      . ibuprofen (ADVIL,MOTRIN) tablet 600 mg  600 mg Oral Q8H PRN Kirichenko, Tatyana, PA-C      . OLANZapine zydis (ZYPREXA) disintegrating tablet 5 mg  5 mg Oral TID Norman Clay, MD      . OLANZapine zydis (ZYPREXA) disintegrating tablet 5 mg  5 mg Oral TID PRN Norman Clay, MD      . ondansetron (ZOFRAN) tablet 4 mg  4 mg Oral Q8H PRN Jeannett Senior, PA-C       Current Outpatient Prescriptions  Medication Sig Dispense Refill  . ibuprofen (ADVIL,MOTRIN) 800 MG tablet Take 1 tablet (800 mg total) by mouth 3 (three) times daily. (Patient not taking: Reported on 03/15/2017) 21 tablet 0    Musculoskeletal: Strength & Muscle Tone: within normal limits Gait & Station: normal Patient leans: N/A  Psychiatric Specialty Exam: Physical Exam  Review of  Systems  Unable to perform ROS: Mental acuity    Blood pressure (!) 118/51, pulse 84, temperature 98.3 F (36.8 C), temperature source Oral, resp. rate 18, SpO2 100 %.There is no height or weight on file to calculate BMI.  General Appearance: Disheveled  Eye Contact:  None  Speech:  mumbles  Volume:  Decreased  Mood:  do not answer  Affect:  hostile, irritable  Thought Process:  NA unable to assess given patient is not cooperative to the interview  Orientation:  Other:  unable to assess given patient is not cooperative to the interview  Thought Content:  unable to assess given patient is not cooperative to the interview  Suicidal Thoughts:  unable to assess given patient is not cooperative to the interview  Homicidal Thoughts:  unable to assess given patient is not cooperative to the interview  Memory:  unable to assess given patient is not cooperative to the interview  Judgement:  Impaired  Insight:  Lacking  Psychomotor Activity:  Increased  Concentration:  Concentration: unable to assess given patient is not cooperative to the interview and Attention Span: unable to assess given patient is not cooperative to the interview  Recall:  unable to assess given patient is not cooperative to the [employer] of Knowledge:  unable to assess given patient is not cooperative to the interview  Language:  unable to assess given patient is not cooperative to the interview  Akathisia:  NA  Handed:  unable to assess given patient is not cooperative to the interview  AIMS (if indicated):     Assets:  Social Support  ADL's:  Intact  Cognition:  unable to assess given patient is not cooperative to the interview  Sleep:    poor   Assessment William Mayer is a 26 y.o. male with no known psychiatry hisotry, who was IVC'd at [facility] for being aggressive towards his family member, Education officer, museum in the setting of marijuana use.   # Unspecified Schizophrenia Spectrum and Other Psychotic Disorder #  r/o substance induced psychotic disorder # r/o schizoaffective disorder Patient is not cooperative to the interview and continues to demonstrate hostile attitude. Based on the collateral from his mother as above, patient has known episodes of anger outburst and hallucinations, which has worsened recently. It is difficult to discern whether this is secondary to his marijuana use. Will start olanzapine to target his psychosis. He will need inpatient stabilization. Will continue IVC given he is danger to others.   Plan - Start Zyprexa Zydis 5 mg TID - Start Zyprexa Zydis 5 mg tid prn for agitation - Referral for inpatient psychiatry/involuntary   Treatment Plan Summary: Daily contact with patient to assess and evaluate symptoms and progress in treatment  Disposition: Recommend psychiatric Inpatient admission when medically cleared.  Norman Clay, MD 03/24/2017 2:25 PM

## 2017-03-24 NOTE — Progress Notes (Signed)
Per psychiatrist [place] request, CSW contacted patient's mother Holly Bodily(Monica Warner [phone]) to obtain collateral information. Patient's mother reported that patient has a history of making threats and that he frequently has outbursts. She reported that when patient has an outburst he gets angry and starts saying Emergency planning/management officeroutlandish stuff. Patient's mother reported that patient randomly bursts into laughter when no one is speaking to him and starts mumbling to himself. She reported that patient has seen a psychiatrist once at the end of last year and that patient has not been on any medications in the past. Patient's mother reported that patient has been increasingly paranoid since being released from prison and that patient spent a lot of time in solitary confinement while in prison. Patient's mother reported that she feels something is wrong with patient and that he needs help. CSW thanked patient's mother for the information provided, psychiatrist informed.   Celso SickleKimberly Brooke Payes, LCSWA [facility]  Clinical Social Worker [phone]

## 2017-03-24 NOTE — ED Notes (Signed)
Patient is soft spoken and minimal.  He is cooperative and calm.  No aggressive behavior noted.  Patient does not answer when asked about harming himself.  Patient ate all of his breakfast.

## 2017-03-24 NOTE — ED Notes (Signed)
Patient speaking with MD and NP.  Would not answer any questions regarding being suicidal.  Patient states, "I've already answered all these questions."  When asked if he wanted to hurt his grandmother he states, "it wasn't my grandmother.  I didn't get into nothing."  "Get my discharge papers ready so I can go.  Where did you go to school?"  Patient is irritable and agitated.  When MD, NP and nurse left the room, he yelled, "I'll smack the shit of you if you come back!"

## 2017-03-24 NOTE — ED Notes (Signed)
SBAR Report received from previous nurse. Pt received calm and visible on unit. Pt gave no answers to assessment questions related to  current SI/ HI, A/V H, depression, anxiety, or pain at this time, and appears otherwise stable and free of distress. Pt reminded of camera surveillance, q 15 min rounds, and rules of the milieu. Will continue to assess.

## 2017-03-25 MED ORDER — DIPHENHYDRAMINE HCL 50 MG/ML IJ SOLN: 25.0000 mg | Freq: Two times a day (BID) | INTRAMUSCULAR | Status: DC

## 2017-03-25 MED ORDER — OLANZAPINE 10 MG IM SOLR: 10.0000 mg | Freq: Every day | INTRAMUSCULAR | Status: DC

## 2017-03-25 MED ORDER — DIPHENHYDRAMINE HCL 25 MG PO CAPS: 25.0000 mg | ORAL_CAPSULE | Freq: Two times a day (BID) | ORAL | Status: DC

## 2017-03-25 MED ORDER — OLANZAPINE 10 MG PO TBDP: 10.0000 mg | ORAL_TABLET | Freq: Every day | ORAL | Status: DC

## 2017-03-25 MED ORDER — OLANZAPINE 10 MG PO TBDP
10.0000 mg | ORAL_TABLET | Freq: Two times a day (BID) | ORAL | Status: DC
  Administered 2017-03-25 – 2017-03-27 (×4): 10 mg via ORAL
  Filled 2017-03-25 (×4): qty 1

## 2017-03-25 MED ORDER — OLANZAPINE 10 MG IM SOLR: 10.0000 mg | Freq: Two times a day (BID) | INTRAMUSCULAR | Status: DC

## 2017-03-25 NOTE — BH Assessment (Signed)
[facility] Assessment Progress Note Patient was seen this date by this writer to re-evaluate and assess treatment progress. Patient refuses to interact with this Clinical research associatewriter and will not answer any questions. This writer explained to the patient the criteria for an IVC and inpatient monitoring (under IVC) asking the patient if he had any questions. Staff had previously informed this Clinical research associatewriter that patient had questions in reference to current IVC. Patient did not respond to this Clinical research associatewriter. Case was staffed Akintayo MD who stated patient continues to meet criteria for inpatient monitoring as appropriate bed placement is investigated.

## 2017-03-25 NOTE — ED Notes (Signed)
Patient is refusing vitals because he wants his discharge papers. Rn Rashell made aware of patient refusal

## 2017-03-25 NOTE — ED Notes (Signed)
Pt has refused VS and all care this shift.

## 2017-03-25 NOTE — ED Notes (Signed)
Up to the bathroom 

## 2017-03-25 NOTE — Progress Notes (Signed)
03/25/17  1357:  Informed by staff to let pt remain sleep.  Caroll RancherMarjette Coleen Cardiff, LRT/CTRS

## 2017-03-25 NOTE — ED Notes (Signed)
Patient is withdrawn. Patient denies SI,HI and AVH at this time. Plan of care of discussed. Encouragement and support provided and safety maintain. Q 15 min safety checks remain in place and video monitoring.

## 2017-03-25 NOTE — ED Provider Notes (Signed)
Notified by Psych MD that patient is refusing medications. On my assessment, patient appears overtly decompensated and disorganized. Agree with plan for forced medications for psychiatric stabilization.   Shaune PollackIsaacs, Lamiah Marmol, MD 03/25/17 1536

## 2017-03-25 NOTE — ED Notes (Signed)
Up to the bathroom, pt engaged in verbal altercation with other pt, both redirected w/o difficulty

## 2017-03-26 DIAGNOSIS — F1290 Cannabis use, unspecified, uncomplicated: Secondary | ICD-10-CM

## 2017-03-26 DIAGNOSIS — F39 Unspecified mood [affective] disorder: Secondary | ICD-10-CM

## 2017-03-26 DIAGNOSIS — F17210 Nicotine dependence, cigarettes, uncomplicated: Secondary | ICD-10-CM

## 2017-03-26 NOTE — ED Notes (Signed)
Patient refused vitals he said he would give them later, patient is upset about not being able to be discharged

## 2017-03-26 NOTE — ED Notes (Signed)
Introduced self to patient. Pt oriented to unit expectations.  Assessed pt for:  A) Anxiety &/or agitation: Pt is guarded with minimal comments. He took po medication this morning, but would not allow this writer to stay in his room to verify that he kept it under his tongue instead of swallowing it or spitting it out. He said, "I don't like your vibe, get out."   S) Safety: Safety maintained with q-15-minute checks and hourly rounds by staff.  A) ADLs: Pt able to perform ADLs independently.  P) Pick-Up (room cleanliness): Pt's room clean and free of clutter.

## 2017-03-26 NOTE — Consult Note (Signed)
[facility] Psych ED Progress Note  03/26/2017 11:17 AM William Mayer  MRN:  [id number] Subjective:  '' Discharge me or get out of my room, I don't belong here.''  Objective: Patient was seen, interviewed and case discussed with treatment team. Patient has been partially compliant with his medication but he remains easily agitated, moody,irritable and psychotic. He has been observed talking to himself and still refusing personal hygiene and grooming.  Principal Problem: Psychotic affective disorder [facility]) Diagnosis:   Patient Active Problem List   Diagnosis Date Noted  . 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 No pertinent past medical history. History reviewed. No pertinent surgical history. Family History: No family history on file. Family Psychiatric  History: Social History:  History  Alcohol Use  . Yes    Comment: occa     History  Drug Use  . Types: Marijuana    Comment: "sometimes"    Social History   Social History  . Marital status: Single    Spouse name: N/A  . Number of children: N/A  . Years of education: N/A   Social History Main Topics  . Smoking status: Current Every Day Smoker    Types: Cigarettes  . Smokeless tobacco: Never Used  . Alcohol use Yes     Comment: occa  . Drug use: Yes    Types: Marijuana     Comment: "sometimes"  . Sexual activity: Not Asked   Other Topics Concern  . None   Social History Narrative  . None    Sleep: Fair  Appetite:  Fair  Current Medications: Current Facility-Administered Medications  Medication Dose Route Frequency Provider Last Rate Last Dose  . acetaminophen (TYLENOL) tablet 650 mg  650 mg Oral Q4H PRN Kirichenko, Tatyana, PA-C      . alum & mag hydroxide-simeth (MAALOX/MYLANTA) 200-200-20 MG/5ML suspension 30 mL  30 mL Oral Q6H PRN Kirichenko, Tatyana, PA-C       . ibuprofen (ADVIL,MOTRIN) tablet 600 mg  600 mg Oral Q8H PRN Kirichenko, Tatyana, PA-C      . OLANZapine zydis (ZYPREXA) disintegrating tablet 10 mg  10 mg Oral BID Charm Rings, NP   10 mg at 03/26/17 1007   Or  . OLANZapine (ZYPREXA) injection 10 mg  10 mg Intramuscular BID Charm Rings, NP      . ondansetron [facility]) tablet 4 mg  4 mg Oral Q8H PRN Jaynie Crumble, PA-C       Current Outpatient Prescriptions  Medication Sig Dispense Refill  . ibuprofen (ADVIL,MOTRIN) 800 MG tablet Take 1 tablet (800 mg total) by mouth 3 (three) times daily. (Patient not taking: Reported on 03/15/2017) 21 tablet 0    Lab Results: No results found for this or any previous visit (from the past 48 hour(s)).  Blood Alcohol level:  Lab Results  Component Value Date   ETH <5 03/23/2017   ETH 70 (H) 03/15/2017    Physical Findings: AIMS:  , ,  ,  ,    CIWA:    COWS:     Musculoskeletal: Strength & Muscle Tone: within normal limits Gait & Station: normal Patient leans: N/A  Psychiatric Specialty Exam: Physical Exam  Psychiatric: Thought content normal. His affect is angry and labile. His speech is rapid and/or pressured. He is agitated, aggressive and actively hallucinating. Cognition and memory are normal. He  expresses impulsivity.    Review of Systems  Constitutional: Negative.   HENT: Negative.   Eyes: Negative.   Respiratory: Negative.   Cardiovascular: Negative.   Gastrointestinal: Negative.   Genitourinary: Negative.   Musculoskeletal: Negative.   Skin: Negative.   Neurological: Negative.   Endo/Heme/Allergies: Negative.   Psychiatric/Behavioral: Positive for hallucinations. The patient is nervous/anxious and has insomnia.     Blood pressure 136/82, pulse (!) 54, temperature 98.3 F (36.8 C), temperature source Oral, resp. rate 18, SpO2 98 %.There is no height or weight on file to calculate BMI.  General Appearance: Casual  Eye Contact:  Minimal  Speech:  Pressured   Volume:  Increased  Mood:  Irritable  Affect:  Labile  Thought Process:  Disorganized  Orientation:  Full (Time, Place, and Person)  Thought Content:  Hallucinations: Auditory  Suicidal Thoughts:  No  Homicidal Thoughts:  No  Memory:  Immediate;   Fair Recent;   Fair Remote;   Fair  Judgement:  Poor  Insight:  Lacking  Psychomotor Activity:  Increased  Concentration:  Concentration: Fair and Attention Span: Fair  Recall:  FiservFair  Fund of Knowledge:  Fair  Language:  Good  Akathisia:  No  Handed:  Right  AIMS (if indicated):     Assets:  Communication Skills Social Support  ADL's:  Intact  Cognition:  WNL  Sleep:   fair      Treatment Plan Summary: Daily contact with patient to assess and evaluate symptoms and progress in treatment, Medication management. Continue patient on current medications. Will benefit from inpatient admission for stabilization. Thedore MinsAkintayo, Geralene Afshar, MD 03/26/2017, 11:17 AM

## 2017-03-26 NOTE — BH Assessment (Addendum)
03/26/2017  Writer attempted to assess this patient. Upon entering his room he sts, "I want to discharge right now". Writer attempted to encourage patient to participate in the the reassessment. Patient stated, "I'm not talking...get out of my room right now". This writer explained to the patient the criteria for an IVC and inpatient monitoring (under IVC) asking the patient if he had any questions. Patient refused to confirm or deny SI, HI, and AVH's. His orientation to time, person, place, and situation is unknown. Appetite and sleep details are unknown. He is calm and laying in his bed. Patient dressed in scrubs. His speech is normal.

## 2017-03-26 NOTE — Progress Notes (Signed)
03/26/17 1348:  LRT went to pt room to offer activities, pt was sleep.  Caroll RancherMarjette Shy Guallpa, LRT/CTRS

## 2017-03-27 DIAGNOSIS — F29 Unspecified psychosis not due to a substance or known physiological condition: Secondary | ICD-10-CM

## 2017-03-27 MED ORDER — OLANZAPINE 10 MG PO TBDP: 10.0000 mg | ORAL_TABLET | Freq: Two times a day (BID) | ORAL | 0 refills | Status: DC

## 2017-03-27 NOTE — ED Notes (Signed)
Introduced self to patient. Pt oriented to unit expectations.  Assessed pt for:  A) Anxiety &/or agitation: Pt is calm and cooperative. He denies SI/HI/AVH. Reports readiness to go home and wants to follow-up with [facility].   S) Safety: Safety maintained with q-15-minute checks and hourly rounds by staff.  A) ADLs: Pt able to perform ADLs independently.  P) Pick-Up (room cleanliness): Pt's room clean and free of clutter.

## 2017-03-27 NOTE — ED Notes (Signed)
Pt discharged home. Discharged instructions read to pt who verbalized understanding. All belongings returned to pt who signed for same. Denies SI/HI, is not delusional and not responding to internal stimuli. Escorted pt to the ED exit.  Pt given bus pass. 

## 2017-03-27 NOTE — Discharge Instructions (Addendum)
For your ongoing mental health needs, you are advised to follow up with [facility].  New and returning patients are seen at their walk-in clinic.  Walk-in hours are Monday - Friday from 8:00 am - 3:00 pm.  Walk-in patients are seen on a first come, first served basis.  Try to arrive as early as possible for he best chance of being seen the same day:       [facility]      [address]      [place], [place] [address]      [phone]  For your shelter needs, contact the following service providers:       [facility]      [address]      [place], [place] [address]      [phone]       [facility]      [address]      [place], [place] [address]      [phone]  For day shelter and other supportive services for the homeless, contact the [facility](IRC):       [facility]      [address]      [place], [place] [address]      [phone]  For transitional housing, contact the following agency.  They provide longer term housing than a shelter, but there is an application process:       [facility]      [facility]      [address]      , [place] [address]      [phone]

## 2017-03-27 NOTE — Consult Note (Signed)
[facility] Face-to-Face Psychiatry Consult   Reason for Consult:  Psychosis  Referring Physician:  EDP Patient Identification: William Mayer MRN:  [id number] Principal Diagnosis: Psychotic affective disorder [facility]) Diagnosis:   Patient Active Problem List   Diagnosis Date Noted  . 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 male patient has stabilized.  HPI:  26 yo male who presented to the ED with psychosis and aggression.  He was started on medications and stabilized.  No suicidal/homicidal ideations, psychosis, aggression, alcohol/drug abuse, or hallucinations.  Stable for discharge which he requests.  [facility] resources provided along with Rx.    Past Psychiatric History: psychosis  Risk to Self: NOne Risk to Others: None Prior Inpatient Therapy: Prior Inpatient Therapy:  (UTA) Prior Therapy Dates:  (UTA) Prior Therapy Facilty/Provider(s):  (UTA) Reason for Treatment:  (UTA) Prior Outpatient Therapy: Prior Outpatient Therapy:  (UTA) Prior Therapy Dates:  (UTA) Prior Therapy Facilty/Provider(s):  (UTA) Reason for Treatment:  (UTA) Does patient have an ACCT team?: Unknown Does patient have Intensive In-House Services?  : Unknown Does patient have [facility] services? : Yes (Per IVC) Does patient have P4CC services?: Unknown  Past Medical History: History reviewed. No pertinent past medical history. History reviewed. No pertinent surgical history. Family History: No family history on file. Family Psychiatric  History: unknown Social History:  History  Alcohol Use  . Yes    Comment: occa     History  Drug Use  . Types: Marijuana    Comment: "sometimes"    Social History   Social History  . Marital status: Single    Spouse name: N/A  . Number of children: N/A  . Years of education: N/A   Social History Main Topics  . Smoking status: Current Every Day  Smoker    Types: Cigarettes  . Smokeless tobacco: Never Used  . Alcohol use Yes     Comment: occa  . Drug use: Yes    Types: Marijuana     Comment: "sometimes"  . Sexual activity: Not Asked   Other Topics Concern  . None   Social History Narrative  . None   Additional Social History:    Allergies:  No Known Allergies  Labs: No results found for this or any previous visit (from the past 48 hour(s)).  Current Facility-Administered Medications  Medication Dose Route Frequency Provider Last Rate Last Dose  . acetaminophen (TYLENOL) tablet 650 mg  650 mg Oral Q4H PRN Kirichenko, Tatyana, PA-C      . alum & mag hydroxide-simeth (MAALOX/MYLANTA) 200-200-20 MG/5ML suspension 30 mL  30 mL Oral Q6H PRN Kirichenko, Tatyana, PA-C      . ibuprofen (ADVIL,MOTRIN) tablet 600 mg  600 mg Oral Q8H PRN Kirichenko, Tatyana, PA-C      . OLANZapine zydis (ZYPREXA) disintegrating tablet 10 mg  10 mg Oral BID Charm Rings, NP   10 mg at 03/27/17 4098   Or  . OLANZapine (ZYPREXA) injection 10 mg  10 mg Intramuscular BID Charm Rings, NP      . ondansetron [facility]) tablet 4 mg  4 mg Oral Q8H PRN Jaynie Crumble, PA-C       Current Outpatient Prescriptions  Medication Sig Dispense Refill  . ibuprofen (ADVIL,MOTRIN) 800 MG tablet Take 1 tablet (800 mg total) by mouth 3 (three) times daily. (Patient not taking: Reported on 03/15/2017) 21  tablet 0    Musculoskeletal: Strength & Muscle Tone: within normal limits Gait & Station: normal Patient leans: N/A  Psychiatric Specialty Exam: Physical Exam  Constitutional: He is oriented to person, place, and time. He appears well-developed and well-nourished.  HENT:  Head: Normocephalic.  Neck: Normal range of motion.  Respiratory: Effort normal.  Musculoskeletal: Normal range of motion.  Neurological: He is alert and oriented to person, place, and time.  Psychiatric: He has a normal mood and affect. His speech is normal and behavior is normal.  Judgment and thought content normal. Cognition and memory are normal.    Review of Systems  All other systems reviewed and are negative.   Blood pressure (!) 121/46, pulse 64, temperature 98.9 F (37.2 C), temperature source Oral, resp. rate 17, SpO2 97 %.There is no height or weight on file to calculate BMI.  General Appearance: Casual  Eye Contact:  Good  Speech:  Normal Rate  Volume:  Normal  Mood:  Euthymic  Affect:  Congruent  Thought Process:  Coherent and Descriptions of Associations: Intact  Orientation:  Full (Time, Place, and Person)  Thought Content:  WDL and Logical  Suicidal Thoughts:  No  Homicidal Thoughts:  No  Memory:  Immediate;   Good Recent;   Good Remote;   Good  Judgement:  Fair  Insight:  Fair  Psychomotor Activity:  Normal  Concentration:  Concentration: Good and Attention Span: Good  Recall:  Good  Fund of Knowledge:  Fair  Language:  Good  Akathisia:  No  Handed:  Right  AIMS (if indicated):     Assets:  Leisure Time Physical Health Resilience Social Support  ADL's:  Intact  Cognition:  WNL  Sleep:        Treatment Plan Summary: Daily contact with patient to assess and evaluate symptoms and progress in treatment, Medication management and Plan psychosis:  -Crisis stabilization -Medication management:  Continued Zyprexa 10 mg BID for psychosis -[facility] resources -Individual counseling  Disposition: No evidence of imminent risk to self or others at present.    Nanine MeansLORD, JAMISON, NP 03/27/2017 12:00 PM  Patient seen face-to-face for psychiatric evaluation, chart reviewed and case discussed with the physician extender and developed treatment plan. Reviewed the information documented and agree with the treatment plan. Thedore MinsMojeed Rishab Stoudt, MD

## 2017-03-27 NOTE — BH Assessment (Signed)
[facility] Assessment Progress Note  Per Thedore MinsMojeed Akintayo, MD, this pt does not require psychiatric hospitalization at this time.  Pt presents under IVC initiated by his mother and upheld by [facility] staff, which Dr Jannifer FranklinAkintayo has rescinded.  [facility]  He is also to be provided with information about supportive services for the homeless.  This has been included in pt's discharge instructions.  Pt's nurse, Diane, has been notified.  Doylene Canninghomas Onisha Cedeno, MA Triage Specialist [phone]

## 2017-03-27 NOTE — [facility] Suicide Risk Assessment (Signed)
Suicide Risk Assessment  Discharge Assessment   [facility] Suicide Risk [facility]   Principal Problem: Psychotic affective disorder [facility](HCC) Discharge Diagnoses:  Patient Active Problem List   Diagnosis Date Noted  . 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 He appears well-developed and well-nourished.  HENT:  Head: Normocephalic.  Neck: Normal range of motion.  Respiratory: Effort normal.  Musculoskeletal: Normal range of motion.  Neurological: He is alert and oriented to person, place, and time.  Psychiatric: He has a normal mood and affect. His speech is normal and behavior is normal. Judgment and thought content normal. Cognition and memory are normal.    Review of Systems  All other systems reviewed and are negative.   Blood pressure (!) 121/46, pulse 64, temperature 98.9 F (37.2 C), temperature source Oral, resp. rate 17, SpO2 97 %.There is no height or weight on file to calculate BMI.  General Appearance: Casual  Eye Contact:  Good  Speech:  Normal Rate  Volume:  Normal  Mood:  Euthymic  Affect:  Congruent  Thought Process:  Coherent and Descriptions of Associations: Intact  Orientation:  Full (Time, Place, and Person)  Thought Content:  WDL and Logical  Suicidal Thoughts:  No  Homicidal Thoughts:  No  Memory:  Immediate;   Good Recent;   Good Remote;   Good  Judgement:  Fair  Insight:  Fair  Psychomotor Activity:  Normal  Concentration:  Concentration: Good and Attention Span: Good  Recall:  Good  Fund of Knowledge:  Fair  Language:  Good  Akathisia:  No  Handed:  Right  AIMS (if indicated):     Assets:  Leisure Time Physical Health Resilience Social Support  ADL's:   Intact  Cognition:  WNL  Sleep:       Mental Status Per Nursing Assessment::   On Admission:   psychosis   Demographic Factors:  Male and Adolescent or young adult  Loss Factors: NA  Historical Factors: NA  Risk Reduction Factors:   Sense of responsibility to family, Living with another person, especially a relative, Positive social support and Positive therapeutic relationship  Continued Clinical Symptoms:  NOne  Cognitive Features That Contribute To Risk:  None    Suicide Risk:  Minimal: No identifiable suicidal ideation.  Patients presenting with no risk factors but with morbid ruminations; may be classified as minimal risk based on the severity of the depressive symptoms    Plan Of Care/Follow-up recommendations:  Activity:  as tolerated Diet:  heart healthy diet  Tyliyah Mcmeekin, NP 03/27/2017, 12:11 PM

## 2017-04-17 ENCOUNTER — Encounter: Payer: Self-pay | Admitting: Family Medicine

## 2017-04-17 ENCOUNTER — Ambulatory Visit: Payer: Self-pay | Admitting: Licensed Clinical Social Worker

## 2017-04-17 ENCOUNTER — Ambulatory Visit: Payer: Self-pay | Attending: Family Medicine | Admitting: Family Medicine

## 2017-04-17 VITALS — BP 96/59 | HR 72 | Temp 98.0°F | Resp 18 | Ht 65.0 in | Wt 133.0 lb

## 2017-04-17 DIAGNOSIS — J301 Allergic rhinitis due to pollen: Secondary | ICD-10-CM

## 2017-04-17 DIAGNOSIS — F39 Unspecified mood [affective] disorder: Secondary | ICD-10-CM

## 2017-04-17 DIAGNOSIS — G4700 Insomnia, unspecified: Secondary | ICD-10-CM | POA: Insufficient documentation

## 2017-04-17 DIAGNOSIS — J302 Other seasonal allergic rhinitis: Secondary | ICD-10-CM | POA: Insufficient documentation

## 2017-04-17 MED ORDER — FLUTICASONE PROPIONATE 50 MCG/ACT NA SUSP: 2.0000 | Freq: Every day | NASAL | 6 refills | Status: DC

## 2017-04-17 MED ORDER — KETOTIFEN FUMARATE 0.025 % OP SOLN: 1.0000 [drp] | Freq: Two times a day (BID) | OPHTHALMIC | 0 refills | Status: DC | PRN

## 2017-04-17 MED ORDER — CETIRIZINE HCL 10 MG PO TABS: 10.0000 mg | ORAL_TABLET | Freq: Every day | ORAL | 11 refills | Status: DC

## 2017-04-17 MED ORDER — OLANZAPINE 20 MG PO TABS: 20.0000 mg | ORAL_TABLET | Freq: Every day | ORAL | 2 refills | Status: DC

## 2017-04-17 MED FILL — FLUTICASONE PROP 50 MCG SPR: 50 | 16 days supply | Qty: 16 | Fill #0

## 2017-04-17 MED FILL — ?CETIRIZINE HCL 10 MG TABLE: 10 | 30 days supply | Qty: 30 | Fill #0

## 2017-04-17 MED FILL — OLANZapine 20 MG TABS: 20 | 30 days supply | Qty: 30 | Fill #0
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
## 2017-04-17 NOTE — BH Specialist Note (Signed)
Integrated Behavioral Health Initial Visit  MRN: [id number] Name: William Mayer   Session Start time: 11:45 AM Session End time: 12:00 PM Total time: 15 minutes  Type of Service: Integrated Behavioral Health- Individual/Family Interpretor:No. Interpretor Name and Language: N/A   Warm Hand Off Completed.       SUBJECTIVE: William Mayer is a 26 y.o. male accompanied by patient. Patient was referred by FNP Hairston for depression and anxiety. 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" Self-Care: Pt has hx participating in counseling and medication management Life Changes: Pt recently discharged from [facility] hospitalization 03/23/2017-03/27/2017  GOALS ADDRESSED: Patient will reduce symptoms of: agitation, anxiety and depression and increase knowledge and/or ability of: coping skills and also: Increase adequate support systems for patient/family and Increase motivation to adhere to plan of care   INTERVENTIONS: Supportive Counseling and Link to [employer] [facility]  Standardized Assessments completed: PHQ 2&9  ASSESSMENT: Patient currently experiencing agitation, depression, and anxiety. He reports feelings of worry, difficulty sleeping, low energy, and difficulty with concentration. Patient receives limited support. Patient may benefit from psychoeducation, psychotherapy, and medication management. During session, patient was guarded and irritable. LCSWA educated patient on how stress can negatively impact one's physical and mental health and encouraged  application of healthy coping skills. Patient was encouraged to [facility] needs and was provided crisis intervention resources.   PLAN: 1. Follow up with behavioral health clinician on : Pt was encouraged to contact LCSWA if symptoms worsen or fail to improve to [facility]. 2. Behavioral recommendations: LCSWA recommends that pt apply healthy coping skills discussed, comply with medication management, and follow up with [facility]. Pt is encouraged to schedule follow up appointment with LCSWA 3. Referral(s): Community Mental Health Services (LME/Outside Clinic) 4. "From scale of 1-10, how likely are you to follow plan?":   Bridgett LarssonJasmine D Elianne Gubser, LCSW 04/18/17 5:22 PM

## 2017-05-16 ENCOUNTER — Ambulatory Visit: Payer: Self-pay | Admitting: Family Medicine

## 2017-05-16 ENCOUNTER — Encounter: Payer: Self-pay | Admitting: Family Medicine

## 2017-05-16 ENCOUNTER — Ambulatory Visit: Payer: Self-pay | Attending: Family Medicine | Admitting: Family Medicine

## 2017-05-16 VITALS — BP 112/63 | HR 73 | Temp 98.2°F | Resp 18 | Ht 64.0 in | Wt 147.0 lb

## 2017-05-16 DIAGNOSIS — J3089 Other allergic rhinitis: Secondary | ICD-10-CM | POA: Insufficient documentation

## 2017-05-16 DIAGNOSIS — H1013 Acute atopic conjunctivitis, bilateral: Secondary | ICD-10-CM | POA: Insufficient documentation

## 2017-05-16 DIAGNOSIS — J302 Other seasonal allergic rhinitis: Secondary | ICD-10-CM

## 2017-05-16 DIAGNOSIS — F259 Schizoaffective disorder, unspecified: Secondary | ICD-10-CM | POA: Insufficient documentation

## 2017-05-16 DIAGNOSIS — F39 Unspecified mood [affective] disorder: Secondary | ICD-10-CM | POA: Insufficient documentation

## 2017-05-16 DIAGNOSIS — R739 Hyperglycemia, unspecified: Secondary | ICD-10-CM | POA: Insufficient documentation

## 2017-05-16 DIAGNOSIS — Z8639 Personal history of other endocrine, nutritional and metabolic disease: Secondary | ICD-10-CM

## 2017-05-16 MED ORDER — OLANZAPINE 20 MG PO TABS: 20.0000 mg | ORAL_TABLET | Freq: Every day | ORAL | 2 refills | Status: DC

## 2017-05-16 MED ORDER — KETOTIFEN FUMARATE 0.025 % OP SOLN: 1.0000 [drp] | Freq: Two times a day (BID) | OPHTHALMIC | 0 refills | Status: DC | PRN

## 2017-05-16 MED ORDER — CETIRIZINE HCL 10 MG PO TABS: 10.0000 mg | ORAL_TABLET | Freq: Every day | ORAL | 11 refills | Status: DC

## 2017-05-16 MED ORDER — FLUTICASONE PROPIONATE 50 MCG/ACT NA SUSP: 2.0000 | Freq: Every day | NASAL | 6 refills | Status: DC

## 2017-05-16 NOTE — Patient Instructions (Signed)
Allergic Conjunctivitis A clear membrane (conjunctiva) covers the white part of your eye and the inner surface of your eyelid. Allergic conjunctivitis happens when this membrane has inflammation. This is caused by allergies. Common causes of allergic reactions (allergens)include:  Outdoor allergens, such as:  Pollen.  Grass and weeds.  Mold spores.  Indoor allergens, such as:  Dust.  Smoke.  Mold.  Pet dander.  Animal hair. This condition can make your eye red or pink. It can also make your eye feel itchy. This condition cannot be spread from one person to another person (is not contagious). Follow these instructions at home:  Try not to be around things that you are allergic to.  Take or apply over-the-counter and prescription medicines only as told by your doctor. These include any eye drops.  Place a cool, clean washcloth on your eye for 10-20 minutes. Do this 3-4 times a day.  Do not touch or rub your eyes.  Do not wear contact lenses until the inflammation is gone. Wear glasses instead.  Do not wear eye makeup until the inflammation is gone.  Keep all follow-up visits as told by your doctor. This is important. Contact a doctor if:  Your symptoms get worse.  Your symptoms do not get better with treatment.  You have mild eye pain.  You are sensitive to light,  You have spots or blisters on your eyes.  You have pus coming from your eye.  You have a fever. Get help right away if:  You have redness, swelling, or other symptoms in only one eye.  Your vision is blurry.  You have vision changes.  You have very bad eye pain. Summary  Allergic conjunctivitis is caused by allergies. It can make your eye red or pink, and it can make your eye feel itchy.  This condition cannot be spread from one person to another person (is not contagious).  Try not to be around things that you are allergic to.  Take or apply over-the-counter and prescription medicines  only as told by your doctor. These include any eye drops.  Contact your doctor if your symptoms get worse or they do not get better with treatment. This information is not intended to replace advice given to you by your health care provider. Make sure you discuss any questions you have with your health care provider. Document Released: 04/04/2010 Document Revised: 06/08/2016 Document Reviewed: 06/08/2016 Elsevier Interactive Patient Education  2017 Elsevier Inc.  

## 2017-05-16 NOTE — Progress Notes (Signed)
Subjective:  Patient ID: William Mayer Trupiano, male    DOB: 31-Dec-1990  Age: 26 y.o. MRN: [id number]  CC: Eye Problem   HPI William Mayer Gessel presents for symptoms of  clear rhinorrhea, itchy eyes and watery eyes. These symptoms are perennial with seasonal exacerbation Symptoms began approximately 2 days. The patient has tried nothing for symptoms. He denies any sick contacts. History of schizoaffective disorder. Reports adherence with medications denies receiving any counseling since last office visit.     Outpatient Medications Prior to Visit  Medication Sig Dispense Refill  . ibuprofen (ADVIL,MOTRIN) 800 MG tablet Take 1 tablet (800 mg total) by mouth 3 (three) times daily. 21 tablet 0  . cetirizine (ZYRTEC) 10 MG tablet Take 1 tablet (10 mg total) by mouth daily. 30 tablet 11  . fluticasone (FLONASE) 50 MCG/ACT nasal spray Place 2 sprays into both nostrils daily. 16 g 6  . ketotifen (ALAWAY) 0.025 % ophthalmic solution Place 1 drop into both eyes 2 (two) times daily as needed. 5 mL 0  . OLANZapine (ZYPREXA) 20 MG tablet Take 1 tablet (20 mg total) by mouth daily. 30 tablet 2   No facility-administered medications prior to visit.     ROS Review of Systems  Constitutional: Negative.   HENT: Positive for rhinorrhea and sneezing.   Eyes: Positive for discharge (watery) and itching. Negative for photophobia, pain, redness and visual disturbance.  Respiratory: Negative.   Cardiovascular: Negative.   Skin: Negative.   Psychiatric/Behavioral: Positive for behavioral problems.   Objective:  BP 112/63 (BP Location: Right Arm, Patient Position: Sitting, Cuff Size: Normal)   Pulse 73   Temp 98.2 F (36.8 C) (Oral)   Resp 18   Ht 5\' 4"  (1.626 m)   Wt 147 lb (66.7 kg)   SpO2 100%   BMI 25.23 kg/m   BP/Weight 05/16/2017 04/17/2017 03/27/2017  Systolic BP 112 96 107  Diastolic BP 63 59 57  Wt. (Lbs) 147 133 -  BMI 25.23 22.13 -     Physical Exam  Constitutional: He appears  well-developed and well-nourished.  Eyes: Pupils are equal, round, and reactive to light. Right eye exhibits no discharge. Left eye exhibits discharge (scant, dried white discharge in the inner canthus). Right conjunctiva is not injected. Left conjunctiva is not injected. No scleral icterus.  Neck: No JVD present.  Cardiovascular: Normal rate, regular rhythm, normal heart sounds and intact distal pulses.   Pulmonary/Chest: Effort normal and breath sounds normal.  Abdominal: Soft. Bowel sounds are normal.  Skin: Skin is warm and dry.  Nursing note and vitals reviewed.   Assessment & Plan:   Problem List Items Addressed This Visit      Other   Psychotic affective disorder (HCC)   Relevant Medications   OLANZapine (ZYPREXA) 20 MG tablet   Other Relevant Orders   Ambulatory referral to Psychiatry    Other Visit Diagnoses    Allergic conjunctivitis of both eyes    -  Primary   Relevant Medications   ketotifen (ALAWAY) 0.025 % ophthalmic solution   Perennial allergic rhinitis with seasonal variation       Relevant Medications   cetirizine (ZYRTEC) 10 MG tablet   fluticasone (FLONASE) 50 MCG/ACT nasal spray   History of hyperglycemia       Relevant Orders   POCT A1C (Completed)      Meds ordered this encounter  Medications  . OLANZapine (ZYPREXA) 20 MG tablet    Sig: Take 1 tablet (20 mg total)  by mouth daily.    Dispense:  30 tablet    Refill:  2    Order Specific Question:   Supervising Provider    Answer:   Quentin Angst [id number]  . ketotifen (ALAWAY) 0.025 % ophthalmic solution    Sig: Place 1 drop into both eyes 2 (two) times daily as needed.    Dispense:  5 mL    Refill:  0    Order Specific Question:   Supervising Provider    Answer:   Quentin Angst [id number]  . cetirizine (ZYRTEC) 10 MG tablet    Sig: Take 1 tablet (10 mg total) by mouth daily.    Dispense:  30 tablet    Refill:  11    Order Specific Question:   Supervising Provider    Answer:    Quentin Angst [id number]  . fluticasone (FLONASE) 50 MCG/ACT nasal spray    Sig: Place 2 sprays into both nostrils daily.    Dispense:  16 g    Refill:  6    Order Specific Question:   Supervising Provider    Answer:   Quentin Angst [id number]    Follow-up: Return if symptoms worsen or fail to improve.   Lizbeth Bark FNP

## 2017-05-17 LAB — POCT GLYCOSYLATED HEMOGLOBIN (HGB A1C): Hemoglobin A1C: 5.2

## 2017-05-22 ENCOUNTER — Other Ambulatory Visit: Payer: Self-pay | Admitting: Pharmacist

## 2017-05-22 ENCOUNTER — Other Ambulatory Visit: Payer: Self-pay | Admitting: Family Medicine

## 2017-05-22 ENCOUNTER — Telehealth: Payer: Self-pay | Admitting: Family Medicine

## 2017-05-22 ENCOUNTER — Ambulatory Visit: Payer: Self-pay | Admitting: Family Medicine

## 2017-05-22 DIAGNOSIS — H1033 Unspecified acute conjunctivitis, bilateral: Secondary | ICD-10-CM

## 2017-05-22 MED ORDER — POLYMYXIN B-TRIMETHOPRIM 10000-0.1 UNIT/ML-% OP SOLN: 1.0000 [drp] | OPHTHALMIC | 0 refills | Status: DC

## 2017-05-22 MED ORDER — POLYMYXIN B-TRIMETHOPRIM 10000-0.1 UNIT/ML-% OP SOLN: 1.0000 [drp] | OPHTHALMIC | 0 refills | Status: AC

## 2017-05-22 NOTE — Telephone Encounter (Signed)
[facility] and [facility]

## 2017-05-22 NOTE — Telephone Encounter (Signed)
CMA call regarding medication prescription   Patient mother Verify DOB  Patient mother  was aware and understood

## 2017-05-22 NOTE — Telephone Encounter (Signed)
Pt. Mother called requesting for trimethoprim-polymyxin b (POLYTRIM) ophthalmic solution Be [facility]. Please f/u

## 2017-05-22 NOTE — Telephone Encounter (Signed)
Pt's mom came asking for a script for son's conjunctivitis. States that pt was not prescribed anything for it. Please f/u.

## 2017-05-22 NOTE — Telephone Encounter (Signed)
Patient was prescribed ketotifen (ALAWAY) 0.025 % ophthalmic solution for conjunctivitis complaint, which is an over-the-counter (OTC) prescription.  I believe his symptoms are related to allergic conjunctivitis, however if he is still experiencing symptoms I will prescribe prescription eye drops and have them sent to pharmacy on file.

## 2017-05-22 NOTE — Telephone Encounter (Signed)
Pt. Mother called stating that she would like for pt. [facility] pharmacy [facility].

## 2017-06-17 ENCOUNTER — Ambulatory Visit: Payer: Self-pay | Attending: Family Medicine | Admitting: Family Medicine

## 2017-06-17 ENCOUNTER — Ambulatory Visit: Payer: Self-pay | Admitting: Licensed Clinical Social Worker

## 2017-06-17 ENCOUNTER — Ambulatory Visit: Payer: Self-pay

## 2017-06-17 ENCOUNTER — Encounter: Payer: Self-pay | Admitting: Family Medicine

## 2017-06-17 VITALS — BP 114/66 | HR 61 | Temp 97.9°F | Resp 18 | Ht 66.0 in | Wt 150.6 lb

## 2017-06-17 DIAGNOSIS — Z79899 Other long term (current) drug therapy: Secondary | ICD-10-CM | POA: Insufficient documentation

## 2017-06-17 DIAGNOSIS — G8929 Other chronic pain: Secondary | ICD-10-CM | POA: Insufficient documentation

## 2017-06-17 DIAGNOSIS — Z131 Encounter for screening for diabetes mellitus: Secondary | ICD-10-CM | POA: Insufficient documentation

## 2017-06-17 DIAGNOSIS — M545 Low back pain: Secondary | ICD-10-CM | POA: Insufficient documentation

## 2017-06-17 DIAGNOSIS — F439 Reaction to severe stress, unspecified: Secondary | ICD-10-CM

## 2017-06-17 DIAGNOSIS — F39 Unspecified mood [affective] disorder: Secondary | ICD-10-CM | POA: Insufficient documentation

## 2017-06-17 DIAGNOSIS — Z833 Family history of diabetes mellitus: Secondary | ICD-10-CM | POA: Insufficient documentation

## 2017-06-17 LAB — POCT GLYCOSYLATED HEMOGLOBIN (HGB A1C): Hemoglobin A1C: 5.3

## 2017-06-17 MED ORDER — NAPROXEN 500 MG PO TABS: ORAL_TABLET | ORAL | 0 refills | Status: DC

## 2017-06-17 MED ORDER — OLANZAPINE 20 MG PO TABS: 20.0000 mg | ORAL_TABLET | Freq: Every day | ORAL | 2 refills | Status: DC

## 2017-06-17 MED FILL — OLANZapine 20 MG TABS: 20 | 30 days supply | Qty: 30 | Fill #0

## 2017-06-17 MED FILL — NAPROXEN 500 MG TABLET: 500 | 30 days supply | Qty: 60 | Fill #0
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
## 2017-06-17 NOTE — Patient Instructions (Signed)

## 2017-06-17 NOTE — BH Specialist Note (Signed)
Integrated Behavioral Health Follow Up Visit  MRN: [id number] Name: William Mayer   Session Start time: 11:30 AM Session End time: 11:45 AM Total time: 15 minutes Number of Integrated Behavioral Health Clinician visits: 2/10  Type of Service: Integrated Behavioral Health- Individual/Family Interpretor:No. Interpretor Name and Language: N/A   Warm Hand Off Completed.       SUBJECTIVE: William Mayer is a 26 y.o. male accompanied by patient. Patient was referred by FNP Hairston for follow up on behavioral health. Patient reports the following symptoms/concerns: Pt did not indicate Duration of problem: N/A; Severity of problem: Pt did not indicate  OBJECTIVE: Mood: Anxious and Affect: Blunt Risk of harm to self or others: No plan to harm self or others   LIFE CONTEXT: Family and Social: Pt reports current family support School/Work: Pt is unemployed. He plans to donate to plasma center Self-Care: Pt participates in behavioral health services at [facility] Life Changes: Pt is experiencing financial strain  GOALS ADDRESSED: Patient will reduce symptoms of: agitation, anxiety and depression and increase knowledge and/or ability of: coping skills and also: Increase adequate support systems for patient/family, Increase motivation to adhere to plan of care and Improve medication compliance  INTERVENTIONS: Motivational Interviewing and Supportive Counseling Standardized Assessments completed: Patient declined screening  ASSESSMENT: Patient previously reported agitation, depression, and anxiety. He reports financial strain. Patient is receiving family support. Patient may benefit from psychoeducation, psychotherapy, and medication management. 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 LCSWA provided information on local programs that assist with career counseling, labor, and transportation assistance. Patient was encouraged to follow up with [facility] for  behavioral health needs. He is knowledgeable of crisis intervention resources.   PLAN: 1. Follow up with behavioral health clinician on : Pt was encouraged tocontact LCSWA if symptoms worsen or fail to improveto schedule behavioral appointments at [facility]. 2. Behavioral recommendations: LCSWA recommends that pt apply healthy coping skills discussed, comply with medication management, and follow up with [facility]. Pt is encouraged to schedule follow up appointment with LCSWA 3. 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 "From scale of 1-10, how likely are you to follow plan?": 5/10  Bridgett Larsson, LCSW 06/20/17 9:20 AM

## 2017-06-17 NOTE — Progress Notes (Signed)
Patient is here for back pian

## 2017-06-24 ENCOUNTER — Ambulatory Visit: Payer: Self-pay

## 2017-10-20 ENCOUNTER — Encounter (HOSPITAL_COMMUNITY): Payer: Self-pay | Admitting: Emergency Medicine

## 2017-10-20 ENCOUNTER — Other Ambulatory Visit: Payer: Self-pay

## 2017-10-20 ENCOUNTER — Emergency Department (HOSPITAL_COMMUNITY)
Admission: EM | Admit: 2017-10-20 | Discharge: 2017-10-20 | Disposition: A | Discharge status: Home / Self Care | Payer: Self-pay | Attending: Emergency Medicine | Admitting: Emergency Medicine

## 2017-10-20 DIAGNOSIS — Z79899 Other long term (current) drug therapy: Secondary | ICD-10-CM | POA: Insufficient documentation

## 2017-10-20 DIAGNOSIS — F10920 Alcohol use, unspecified with intoxication, uncomplicated: Secondary | ICD-10-CM

## 2017-10-20 DIAGNOSIS — F10120 Alcohol abuse with intoxication, uncomplicated: Secondary | ICD-10-CM | POA: Insufficient documentation

## 2017-10-20 DIAGNOSIS — E162 Hypoglycemia, unspecified: Secondary | ICD-10-CM | POA: Insufficient documentation

## 2017-10-20 DIAGNOSIS — Y902 Blood alcohol level of 40-59 mg/100 ml: Secondary | ICD-10-CM | POA: Insufficient documentation

## 2017-10-20 LAB — COMPREHENSIVE METABOLIC PANEL
ALK PHOS: 65 U/L (ref 38–126)
ALT: 22 U/L (ref 17–63)
AST: 66 U/L — AB (ref 15–41)
Albumin: 3.1 g/dL — ABNORMAL LOW (ref 3.5–5.0)
Anion gap: 14 (ref 5–15)
BILIRUBIN TOTAL: 0.5 mg/dL (ref 0.3–1.2)
BUN: 15 mg/dL (ref 6–20)
CALCIUM: 8.1 mg/dL — AB (ref 8.9–10.3)
CO2: 17 mmol/L — ABNORMAL LOW (ref 22–32)
CREATININE: 1.14 mg/dL (ref 0.61–1.24)
Chloride: 104 mmol/L (ref 101–111)
Glucose, Bld: 154 mg/dL — ABNORMAL HIGH (ref 65–99)
Potassium: 4.1 mmol/L (ref 3.5–5.1)
Sodium: 135 mmol/L (ref 135–145)
TOTAL PROTEIN: 6 g/dL — AB (ref 6.5–8.1)

## 2017-10-20 LAB — CBC
HEMATOCRIT: 44.7 % (ref 39.0–52.0)
HEMOGLOBIN: 14.6 g/dL (ref 13.0–17.0)
MCH: 32.2 pg (ref 26.0–34.0)
MCHC: 32.7 g/dL (ref 30.0–36.0)
MCV: 98.7 fL (ref 78.0–100.0)
Platelets: 218 10*3/uL (ref 150–400)
RBC: 4.53 MIL/uL (ref 4.22–5.81)
RDW: 12.5 % (ref 11.5–15.5)
WBC: 11.7 10*3/uL — AB (ref 4.0–10.5)

## 2017-10-20 LAB — RAPID URINE DRUG SCREEN, HOSP PERFORMED
AMPHETAMINES: NOT DETECTED
BARBITURATES: NOT DETECTED
BENZODIAZEPINES: NOT DETECTED
COCAINE: POSITIVE — AB
Opiates: NOT DETECTED
TETRAHYDROCANNABINOL: POSITIVE — AB

## 2017-10-20 LAB — CBG MONITORING, ED: Glucose-Capillary: 131 mg/dL — ABNORMAL HIGH (ref 65–99)

## 2017-10-20 LAB — ETHANOL: ALCOHOL ETHYL (B): 57 mg/dL — AB (ref <10)

## 2017-10-20 MED ORDER — SODIUM CHLORIDE 0.9 % IV BOLUS (SEPSIS): 1000.0000 mL | Freq: Once | INTRAVENOUS | Status: DC

## 2017-10-20 NOTE — ED Provider Notes (Signed)
[facility] EMERGENCY [facility]PART[facility] Provider Note   CSN: [id number] Arrival date & time: 10/20/17  1114     History   Chief Complaint Chief Complaint  Patient presents with  . Alcohol Intoxication    HPI William Mayer is a 26 y.o. male presents for alcohol intoxication and low blood glucose. States he is here because is "hung over". Reports heavy drinking last night, last drink over 12 hours ago. Cannot tell me exactly how much or what type of alcohol. He is hungry and thirsty, and requesting food. States EMS was called because his sugars were low. States he "couldn't move" and that's how knew his sugars were low. Per EMS CBG was 67, improved to 167 after D10. Denies daily ETOH use or other illicit drug use. Denies trauma, falls, HA, neck pain, vision changes, CP, SOB, abdominal pain, nausea, vomiting. Denies pain anywhere. Denies chronic medical conditions or daily medicines. Denies ETOH withdrawal in the past.   HPI  History reviewed. No pertinent past medical history.  Patient Active Problem List   Diagnosis Date Noted  . Chronic bilateral low back pain without sciatica 06/17/2017  . Psychotic affective disorder (HCC) 03/24/2017    History reviewed. No pertinent surgical history.     Home Medications    Prior to Admission medications   Medication Sig Start Date End Date Taking? Authorizing Provider  cetirizine (ZYRTEC) 10 MG tablet Take 1 tablet (10 mg total) by mouth daily. 05/16/17   Lizbeth BarkHairston, Mandesia R, FNP  fluticasone (FLONASE) 50 MCG/ACT nasal spray Place 2 sprays into both nostrils daily. 05/16/17   Lizbeth BarkHairston, Mandesia R, FNP  ketotifen (ALAWAY) 0.025 % ophthalmic solution Place 1 drop into both eyes 2 (two) times daily as needed. 05/16/17   Lizbeth BarkHairston, Mandesia R, FNP  naproxen (NAPROSYN) 500 MG tablet TAKE ONE TABLET BY MOUTH TWICE DAILY WITH MEALS FOR 5 DAYS. THEN TAKE ONE TABLET BY MOUTH ONCE DAILY WITH MEALS AS NEEDED. 06/17/17   Hairston, Oren BeckmannMandesia  R, FNP  OLANZapine (ZYPREXA) 20 MG tablet Take 1 tablet (20 mg total) by mouth daily. 06/17/17   Lizbeth BarkHairston, Mandesia R, FNP    Family History No family history on file.  Social History Social History   Tobacco Use  . Smoking status: Current Every Day Smoker    Packs/day: 0.25    Types: Cigarettes  . Smokeless tobacco: Never Used  . Tobacco comment: 1 or 2  Substance Use Topics  . Alcohol use: Yes  . Drug use: Yes    Types: Marijuana    Comment: "sometimes"     Allergies   Patient has no known allergies.   Review of Systems Review of Systems  Constitutional:       +alcohol intoxication +low blood sugar   All other systems reviewed and are negative.    Physical Exam Updated Vital Signs BP 125/74   Pulse 84   Temp 97.6 F (36.4 C) (Oral)   Resp 16   SpO2 100%   Physical Exam  Constitutional: He is oriented to person, place, and time. He appears well-developed and well-nourished. No distress.  NAD. Standing up using urinal.   HENT:  Head: Normocephalic and atraumatic.  Right Ear: External ear normal.  Left Ear: External ear normal.  Nose: Nose normal.  Moist mucous membranes. No intraoral lesions or injury. Atraumatic scalp and face w/o tenderness.   Eyes: Conjunctivae are normal. No scleral icterus.  Neck: Normal range of motion. Neck supple.  No midline c-spine tenderness.  Cardiovascular: Normal rate, regular rhythm, normal heart sounds and intact distal pulses.  No murmur heard. 2+ DP and radial pulses bilaterally  Pulmonary/Chest: Effort normal and breath sounds normal. He has no wheezes.  Abdominal: Soft. There is no tenderness.  Musculoskeletal: Normal range of motion. He exhibits no deformity.  Ambulating and moving all four extremities spontaneously w/o obvious sign of pain or disability.   Neurological: He is alert and oriented to person, place, and time.  No dysarthria. No nystagmus. Strength 5/5 with hand grip and ankle flexion/extension.     Sensation to light touch intact in hands and feet. Steady gait. CN I and VIII not tested. CN II-XII intact bilaterally.   Skin: Skin is warm and dry. Capillary refill takes less than 2 seconds.  Psychiatric: He has a normal mood and affect. His behavior is normal. Judgment and thought content normal.  Nursing note and vitals reviewed.    ED Treatments / Results  Labs (all labs ordered are listed, but only abnormal results are displayed) Labs Reviewed  COMPREHENSIVE METABOLIC PANEL - Abnormal; Notable for the following components:      Result Value   CO2 17 (*)    Glucose, Bld 154 (*)    Calcium 8.1 (*)    Total Protein 6.0 (*)    Albumin 3.1 (*)    AST 66 (*)    All other components within normal limits  [name] - Abnormal; Notable for the following components:   Alcohol, Ethyl (B) 57 (*)    All other components within normal limits  CBC - Abnormal; Notable for the following components:   WBC 11.7 (*)    All other components within normal limits  RAPID URINE DRUG SCREEN, HOSP PERFORMED - Abnormal; Notable for the following components:   Cocaine POSITIVE (*)    Tetrahydrocannabinol POSITIVE (*)    All other components within normal limits  CBG MONITORING, ED - Abnormal; Notable for the following components:   Glucose-Capillary 131 (*)    All other components within normal limits    EKG  EKG Interpretation None       Radiology No results found.  Procedures Procedures (including critical care time)  Medications Ordered in ED Medications  sodium chloride 0.9 % bolus 1,000 mL (not administered)     Initial Impression / Assessment and Plan / ED Course  I have reviewed the triage vital signs and the nursing notes.  Pertinent labs & imaging results that were available during my care of the patient were reviewed by me and considered in my medical decision making (see chart for details).    26 yo presents for ETOH intoxication and hypoglycemic event. He has no h/o  DM. Given D10 by EMS and has maintained glucose WNL. Exam reassuring. Lab work reassuring. +cocaine and ETOH. He was hydrated in ED. He is tolerating PO fluids and food. Repeat CBG WNL. No indication for further emergent lab work or imaging today. Pt will be discharged in good condition.   Final Clinical Impressions(s) / ED Diagnoses   Final diagnoses:  Alcoholic intoxication without complication [facility](HCC)  Non-diabetic hypoglycemia    ED Discharge Orders    None       Jerrell MylarGibbons, Elmyra Banwart J, PA-C 10/20/17 1315    Benjiman CorePickering, Nathan, MD 10/20/17 1642

## 2017-10-20 NOTE — Discharge Instructions (Signed)
Lab work shows cocaine and alcohol in your system.   Drinking heavy amounts of alcohol without food can cause drop in sugar.   Continue to stay hydrated.

## 2017-10-20 NOTE — ED Triage Notes (Signed)
EMS- [facility] and lye on the floor stating "ya'll need to call 911 I am hung over."  Pt states he drank 1 large bottle of vodka and 1 large bottle of gin and smoked weed." CBG was corrected from 67 to 167 with D10, zofran given in route. NAD at this time. A/O.

## 2017-10-20 NOTE — ED Notes (Signed)
water given to pt

## 2017-11-21 DIAGNOSIS — M545 Low back pain: Secondary | ICD-10-CM

## 2018-02-12 ENCOUNTER — Ambulatory Visit: Payer: Self-pay | Attending: Family Medicine

## 2018-08-14 ENCOUNTER — Other Ambulatory Visit: Payer: Self-pay

## 2018-08-14 ENCOUNTER — Encounter: Payer: Self-pay | Admitting: Family Medicine

## 2018-08-14 ENCOUNTER — Ambulatory Visit: Payer: Medicaid Other | Attending: Family Medicine | Admitting: Family Medicine

## 2018-08-14 VITALS — BP 116/64 | HR 78 | Temp 98.8°F | Resp 18 | Ht 64.0 in | Wt 130.0 lb

## 2018-08-14 DIAGNOSIS — J309 Allergic rhinitis, unspecified: Secondary | ICD-10-CM

## 2018-08-14 DIAGNOSIS — Z8249 Family history of ischemic heart disease and other diseases of the circulatory system: Secondary | ICD-10-CM | POA: Diagnosis not present

## 2018-08-14 DIAGNOSIS — G8929 Other chronic pain: Secondary | ICD-10-CM | POA: Diagnosis not present

## 2018-08-14 DIAGNOSIS — F17210 Nicotine dependence, cigarettes, uncomplicated: Secondary | ICD-10-CM | POA: Insufficient documentation

## 2018-08-14 DIAGNOSIS — M545 Low back pain: Secondary | ICD-10-CM | POA: Diagnosis not present

## 2018-08-14 DIAGNOSIS — E010 Iodine-deficiency related diffuse (endemic) goiter: Secondary | ICD-10-CM | POA: Diagnosis not present

## 2018-08-14 MED ORDER — FLUTICASONE PROPIONATE 50 MCG/ACT NA SUSP: 2.0000 | Freq: Every day | NASAL | 6 refills | Status: DC

## 2018-08-14 MED ORDER — CETIRIZINE HCL 10 MG PO TABS: 10.0000 mg | ORAL_TABLET | Freq: Every day | ORAL | 11 refills | Status: DC

## 2018-08-14 NOTE — Progress Notes (Signed)
Subjective:    Patient ID: William Mayer, male    DOB: September 12, 1991, 27 y.o.   MRN: [id number]  HPI 27 yo male who was last seen in this office on 06/17/2017 due to chronic low back pain and patient is s/p 10/20/2017 ED visit due to alcohol intoxication and hypoglycemia. UDS at the ED was positive for cocaine and cannibus. Glucose was elevated at 154. Hgb A1c on 06/17/17 was normal at 5.3.      Patient at today's visit states that he is needing refills of medications for treatment of nasal congestion/nasal allergy symptoms.  Patient with some occasional dull, frontal headaches.  Patient also with sensation at times of postnasal drainage and patient has some clear nasal discharge and sneezing.  Patient reports that he otherwise feels fine.  Patient states that he will need a physical for job that he is planning to apply for.  Patient states that he will make a follow-up appointment for the physical.  Patient denies any other medical issues at today's visit.  Patient reports no history of surgeries.  Patient reports negative family history of diabetes, hypertension, stroke, heart disease or cancer.  Family History  Problem Relation Age of Onset  . Gout Father   . Arthritis Father    Social History   Tobacco Use  . Smoking status: Light Tobacco Smoker    Packs/day: 0.25    Types: Cigarettes  . Smokeless tobacco: Never Used  . Tobacco comment: 1 or 2  Substance Use Topics  . Alcohol use: Not Currently  . Drug use: Yes    Types: Marijuana    Comment: "sometimes"  No Known Allergies   Review of Systems  Constitutional: Negative for chills, diaphoresis, fatigue and fever.  HENT: Positive for congestion, postnasal drip, rhinorrhea, sinus pressure and sneezing. Negative for ear pain, hearing loss, sinus pain, sore throat and trouble swallowing.   Respiratory: Negative for cough and shortness of breath.   Cardiovascular: Negative for chest pain, palpitations and leg swelling.    Gastrointestinal: Negative for abdominal pain and nausea.  Endocrine: Negative for polydipsia, polyphagia and polyuria.  Genitourinary: Negative for dysuria and frequency.  Musculoskeletal: Positive for back pain. Negative for arthralgias, gait problem, joint swelling and myalgias.  Neurological: Positive for headaches. Negative for dizziness.       Objective:   Physical Exam BP 116/64   Pulse 78   Temp 98.8 F (37.1 C) (Oral)   Resp 18   Ht 5\' 4"  (1.626 m)   Wt 130 lb (59 kg)   SpO2 97%   BMI 22.31 kg/m Vital signs and nurse's notes reviewed General-well-nourished, well-developed short statured adult male who appears slightly younger than stated age ENT- TMs dull, nares with pale, edematous nasal turbinates, mild clear nasal discharge, patient with mild posterior pharynx erythema Neck-supple, patient with large neck size versus thyromegaly, no carotid bruit, no lymphadenopathy Lungs-clear to auscultation bilaterally Cardiovascular-regular rate and rhythm Abdomen-soft, nontender Back-no CVA tenderness, patient with mild lumbosacral discomfort to palpation Extremities-no edema        Assessment & Plan:  1. Allergic rhinitis, unspecified seasonality, unspecified trigger Patient provided with refill of Flonase nasal spray and prescription provided for cetirizine refill to take at bedtime to help with nasal congestion/nasal allergy symptoms - fluticasone (FLONASE) 50 MCG/ACT nasal spray; Place 2 sprays into both nostrils daily.  Dispense: 16 g; Refill: 6 - cetirizine (ZYRTEC) 10 MG tablet; Take 1 tablet (10 mg total) by mouth daily.  Dispense: 30 tablet;  Refill: 11  2. Thyromegaly Patient with thyromegaly on examination and will be referred for thyroid ultrasound. - US THYROID; Future  *Influenza immunization was offered at today's visit but patient declined  An After Visit Summary was printed and given to the patient.  Return if symptoms worsen or fail to improve, for  needs physical exam.

## 2018-08-18 ENCOUNTER — Telehealth: Payer: Self-pay | Admitting: Family Medicine

## 2018-08-18 ENCOUNTER — Encounter: Payer: Self-pay | Admitting: Family Medicine

## 2018-08-18 NOTE — Telephone Encounter (Signed)
Merry Proud called because she needed a pre-auth for an ultrasound for the thyroid OCT.24

## 2018-08-19 NOTE — Telephone Encounter (Signed)
PriorAuth started 08/19/18, [place] Case # [id number]  Authorization code: unavailable due to being under review.  Paperwork was faxed 08-19-18 to [phone]

## 2018-08-21 ENCOUNTER — Ambulatory Visit (HOSPITAL_COMMUNITY): Payer: Medicaid Other

## 2018-08-25 NOTE — Telephone Encounter (Signed)
Brandie from the pre service center called stating that the patient has an appointment for an ultrasound tomorrow and the prior auth was denied. Please f/u   Brandie  Phone: [phone] Ext. [phone]

## 2018-08-26 ENCOUNTER — Telehealth: Payer: Self-pay | Admitting: *Deleted

## 2018-08-26 ENCOUNTER — Ambulatory Visit (HOSPITAL_COMMUNITY): Payer: Medicaid Other

## 2018-08-26 ENCOUNTER — Other Ambulatory Visit: Payer: Self-pay | Admitting: Family Medicine

## 2018-08-26 DIAGNOSIS — E010 Iodine-deficiency related diffuse (endemic) goiter: Secondary | ICD-10-CM

## 2018-08-26 NOTE — Telephone Encounter (Signed)
Pt mother, Maxine Glenn  called to understand why his prior authorization was denied. What can be done going forward. Provided number to [facility] as requested. Writer will call [employer] to understand as well.

## 2018-08-26 NOTE — Telephone Encounter (Signed)
I will place the order for the TSH that patient can have done at his convenience

## 2018-08-26 NOTE — Progress Notes (Signed)
Patient ID: William Mayer, male   DOB: 02-08-1991, 27 y.o.   MRN: [id number]   See recent phone encounter.  Patient needs to have TSH done in follow-up of his thyromegaly before his insurance will cover his thyroid ultrasound.  Order will be placed for patient to have TSH and nurse will notify patient/parent to have the lab done at patient's convenience.

## 2018-08-26 NOTE — Telephone Encounter (Signed)
Please order TSH prior to Ultrasound  to see if Ultrasound is needed per [employer].

## 2018-08-28 NOTE — Telephone Encounter (Signed)
Attempt to call patient to schedule lab test for TSH.

## 2018-09-01 NOTE — Telephone Encounter (Signed)
Verified  DOB. Appointment scheduled for 09/05/2018 for TSH. Verbalized understanding to appointment.

## 2018-09-05 ENCOUNTER — Ambulatory Visit: Payer: Medicaid Other | Attending: Family Medicine

## 2018-09-05 DIAGNOSIS — E010 Iodine-deficiency related diffuse (endemic) goiter: Secondary | ICD-10-CM

## 2018-09-06 LAB — T4 AND TSH
T4, Total: 5.2 ug/dL (ref 4.5–12.0)
TSH: 2.04 u[IU]/mL (ref 0.450–4.500)

## 2018-09-09 ENCOUNTER — Encounter: Payer: Self-pay | Admitting: Family Medicine

## 2018-09-09 ENCOUNTER — Ambulatory Visit: Payer: Medicaid Other | Attending: Family Medicine | Admitting: Family Medicine

## 2018-09-09 VITALS — BP 108/59 | HR 61 | Temp 97.8°F | Resp 18 | Ht 65.0 in | Wt 131.0 lb

## 2018-09-09 DIAGNOSIS — J309 Allergic rhinitis, unspecified: Secondary | ICD-10-CM

## 2018-09-09 DIAGNOSIS — M79671 Pain in right foot: Secondary | ICD-10-CM | POA: Insufficient documentation

## 2018-09-09 DIAGNOSIS — R0982 Postnasal drip: Secondary | ICD-10-CM | POA: Diagnosis not present

## 2018-09-09 DIAGNOSIS — Z79899 Other long term (current) drug therapy: Secondary | ICD-10-CM

## 2018-09-09 DIAGNOSIS — F17210 Nicotine dependence, cigarettes, uncomplicated: Secondary | ICD-10-CM | POA: Diagnosis not present

## 2018-09-09 DIAGNOSIS — Z760 Encounter for issue of repeat prescription: Secondary | ICD-10-CM | POA: Diagnosis not present

## 2018-09-09 DIAGNOSIS — E010 Iodine-deficiency related diffuse (endemic) goiter: Secondary | ICD-10-CM | POA: Insufficient documentation

## 2018-09-09 DIAGNOSIS — Z0000 Encounter for general adult medical examination without abnormal findings: Secondary | ICD-10-CM | POA: Diagnosis not present

## 2018-09-09 MED ORDER — NAPROXEN 500 MG PO TABS: ORAL_TABLET | ORAL | 0 refills | Status: DC

## 2018-09-09 MED ORDER — FLUTICASONE PROPIONATE 50 MCG/ACT NA SUSP: 2.0000 | Freq: Every day | NASAL | 6 refills | Status: DC

## 2018-09-09 MED ORDER — CETIRIZINE HCL 10 MG PO TABS: 10.0000 mg | ORAL_TABLET | Freq: Every day | ORAL | 11 refills | Status: DC

## 2018-09-09 MED FILL — CETIRIZINE HCL 10 MG TABLET: 10 | 30 days supply | Qty: 30 | Fill #0

## 2018-09-09 MED FILL — NAPROXEN 500 MG TABLET: 500 | 30 days supply | Qty: 60 | Fill #0

## 2018-09-09 MED FILL — FLUTICASONE PROP 50 MCG SPR: 50 | 30 days supply | Qty: 16 | Fill #0

## 2018-09-09 NOTE — Progress Notes (Signed)
Subjective:    Patient ID: William Mayer, male    DOB: 09-19-1991, 27 y.o.   MRN: [id number]  HPI 27 year old male who states that he is here for annual well exam.  Patient however also states that he needs a refill of his allergy medications as he is currently out.  Patient does have some clear nasal discharge and postnasal drainage.  Patient denies any sneezing.  No headache or dizziness, no fever or chills.  Patient is not fasting at today's visit.  Patient denies any issues at today's visit.  Patient would like to know if he has been approved to have an ultrasound of his thyroid gland.  Patient denies any significant family history.  Patient reports no past history of surgeries.  Patient reports that he has had some recurrent right heel pain and would like to have a refill of naproxen.   Past Medical History:  Diagnosis Date  . Allergic rhinitis    Past Surgical History:  Procedure Laterality Date  . NO PAST SURGERIES     Family History  Problem Relation Age of Onset  . Gout Father   . Arthritis Father    Social History   Tobacco Use  . Smoking status: Light Tobacco Smoker    Packs/day: 0.25    Types: Cigarettes  . Smokeless tobacco: Never Used  . Tobacco comment: 1 or 2  Substance Use Topics  . Alcohol use: Not Currently  . Drug use: Yes    Types: Marijuana    Comment: "sometimes"  No Known Allergies     Review of Systems  Constitutional: Negative for chills, fatigue and fever.  HENT: Positive for congestion, postnasal drip and rhinorrhea. Negative for dental problem, sinus pressure, sinus pain, sneezing, sore throat and trouble swallowing.   Respiratory: Negative for cough and shortness of breath.   Cardiovascular: Negative for chest pain, palpitations and leg swelling.  Gastrointestinal: Negative for abdominal pain, constipation, diarrhea and nausea.  Endocrine: Negative for polydipsia, polyphagia and polyuria.  Genitourinary: Negative for dysuria, flank pain  and frequency.  Musculoskeletal: Positive for arthralgias (right heel pain). Negative for back pain and gait problem.  Neurological: Negative for dizziness and headaches.       Objective:   Physical Exam BP (!) 108/59 (BP Location: Left Arm, Patient Position: Sitting, Cuff Size: Normal)   Pulse 61   Temp 97.8 F (36.6 C) (Oral)   Resp 18   Ht 5\' 5"  (1.651 m)   Wt 131 lb (59.4 kg)   SpO2 99%   BMI 21.80 kg/m Nurse's notes and vital signs reviewed General- well-nourished, well-developed short statured male in no acute distress. EENT- TMs light pink bilaterally but landmarks are visible, conjunctiva are normal.  Nares show edema of the nasal turbinates which are pale and patient with clear to light green mucoid discharge.  Patient with posterior pharynx erythema-mild. Neck- patient with thyromegaly and patient with increased vascularity on the right side of the neck.  No cervical adenopathy.  No carotid bruit. Lungs-clear to auscultation bilaterally Cardiovascular-regular rate and rhythm Back-no CVA tenderness Extremities-no edema Psych-blunted/flattened affect          Assessment & Plan:  1. Well adult exam Handout provided as part of AVS on well adult health maintenance for patient's age group.  Patient was nonfasting at today's visit and may return for fasting lipids at his convenience.  Patient did not have any significant abnormalities on the today's exam.  Patient was offered influenza immunization which he  declined.  2. Allergic rhinitis, unspecified seasonality, unspecified trigger Patient with chronic issues with allergic rhinitis.  Prescriptions provided for refills of patient's Zyrtec and Flonase. - cetirizine (ZYRTEC) 10 MG tablet; Take 1 tablet (10 mg total) by mouth daily.  Dispense: 30 tablet; Refill: 11 - fluticasone (FLONASE) 50 MCG/ACT nasal spray; Place 2 sprays into both nostrils daily.  Dispense: 16 g; Refill: 6  3. Encounter for long-term (current) use of  medications Patient is on Zyprexa per his mental health providers at [facility] and patient has had elevated blood sugars on past blood work.  Patient's last hemoglobin A1c however was within normal.  Patient will have repeat hemoglobin A1c as well as liver function test in follow-up medication use. - Comprehensive metabolic panel  -Patient with thyromegaly and thyroid ultrasound was ordered at his last visit however his insurance company denied coverage for thyroid ultrasound because patient had not had thyroid blood work.  Patient did recently return for TSH and T4 both of which were normal.  Nursing will find out if patient's insurance will now cover the thyroid ultrasound and patient will be notified.  An After Visit Summary was printed and given to the patient.  Return in about 6 months (around 03/10/2019) for allergic rhinitis.

## 2018-09-09 NOTE — Patient Instructions (Signed)

## 2018-09-10 LAB — COMPREHENSIVE METABOLIC PANEL WITH GFR
ALT: 12 IU/L (ref 0–44)
AST: 26 IU/L (ref 0–40)
Albumin/Globulin Ratio: 1.5 (ref 1.2–2.2)
Albumin: 4.4 g/dL (ref 3.5–5.5)
Alkaline Phosphatase: 79 IU/L (ref 39–117)
BUN/Creatinine Ratio: 9 (ref 9–20)
BUN: 11 mg/dL (ref 6–20)
Bilirubin Total: 0.3 mg/dL (ref 0.0–1.2)
CO2: 21 mmol/L (ref 20–29)
Calcium: 9.7 mg/dL (ref 8.7–10.2)
Chloride: 104 mmol/L (ref 96–106)
Creatinine, Ser: 1.18 mg/dL (ref 0.76–1.27)
GFR calc Af Amer: 96 mL/min/1.73
GFR calc non Af Amer: 83 mL/min/1.73
Globulin, Total: 3 g/dL (ref 1.5–4.5)
Glucose: 87 mg/dL (ref 65–99)
Potassium: 4.5 mmol/L (ref 3.5–5.2)
Sodium: 142 mmol/L (ref 134–144)
Total Protein: 7.4 g/dL (ref 6.0–8.5)

## 2018-09-10 LAB — HEMOGLOBIN A1C

## 2018-09-16 ENCOUNTER — Telehealth: Payer: Self-pay | Admitting: *Deleted

## 2018-09-16 NOTE — Telephone Encounter (Signed)
Prior Authorization approval: [id number] Appointment scheduled pt aware of appointment.

## 2018-09-22 ENCOUNTER — Ambulatory Visit (HOSPITAL_COMMUNITY): Admission: RE | Admit: 2018-09-22 | Payer: Medicaid Other | Source: Ambulatory Visit

## 2018-09-23 ENCOUNTER — Telehealth: Payer: Self-pay | Admitting: Family Medicine

## 2018-09-23 NOTE — Telephone Encounter (Signed)
Patient called and stated they would like to discuss an appointment that was set up by the nurse. Please follow up with patient.

## 2018-09-24 ENCOUNTER — Other Ambulatory Visit: Payer: Self-pay | Admitting: *Deleted

## 2018-09-24 DIAGNOSIS — F39 Unspecified mood [affective] disorder: Secondary | ICD-10-CM

## 2018-09-24 MED ORDER — OLANZAPINE 20 MG PO TABS: 20.0000 mg | ORAL_TABLET | Freq: Every day | ORAL | 0 refills | Status: DC

## 2018-09-24 MED FILL — OLANZapine 20 MG TABS: 20 | 30 days supply | Qty: 30 | Fill #0

## 2018-09-24 NOTE — Telephone Encounter (Signed)
Mother states she missed the appointment on Monday but going back she was going to have a hard to time to get him to cooperatate with the procedure because of his [facility] encounter [facility].  [facility].  She has not been able to get him an appointment [facility] since last hospitalization. She was told she would have [facility] to [facility].  She notices that he is more paranoid than usual and going to the appointment for the ultrasound has made him more "snappy" and on edge so he does not trust that she is trying to help. She has to watch wording when making appointments. He is very on the edge. Before the insurance was lost, he was taking OLANZapine (Zyprexa) 20 mg. She said this help him very much and request medication be sent to pharmacy.   She also did not know if there was another facility that she could take him to for mental health, now since he has Medicaid. [facility] was suggested to have a consult.

## 2018-09-24 NOTE — Telephone Encounter (Signed)
Pt mother states she missed appointment on Monday. She got the days confused. She would like to discuss something about the patient but is unable to do so at this time because she is at work. I will give Mrs. Sheliah [employer]atchWarner a call

## 2018-09-24 NOTE — Telephone Encounter (Signed)
Spoke to Jenel LucksJasmine Lewis and she recommended patient follow up with the [facility] [address]  Phone number given : [phone].   Encour[facility] to call [facility] for further concerns or questions.

## 2018-12-02 MED FILL — ARIPiprazole 10 MG TABS: 10 | 30 days supply | Qty: 22 | Fill #0

## 2019-02-10 MED FILL — FLUTICASONE PROP 50 MCG SPR: 50 | 30 days supply | Qty: 16 | Fill #1

## 2019-02-10 MED FILL — CETIRIZINE HCL 10 MG TABS: 10 | 30 days supply | Qty: 30 | Fill #1

## 2019-02-10 MED FILL — ARIPiprazole 10 MG TABS: 10 | 19 days supply | Qty: 22 | Fill #1

## 2019-04-01 ENCOUNTER — Telehealth: Payer: Self-pay | Admitting: *Deleted

## 2019-04-01 NOTE — Telephone Encounter (Signed)
Spoke with patient mother, per pt mother, patient is fatigue all the time and don't have a lot of energy anymore. Per pt mother, patient had this in the past but that was when he drinking alcohol. Staff asked patient mother if patient was drinking again recently and mother stated yes. Per pt mother she didn't know what was going on with patient but he is not like himself. Per pt mother, she do not know if patient needed to get tested for Covid 19 since patient was seeing doctor and up at the office and already getting labs drawn. Staff informed patient mother that Covid 25 test is not done at the doctor offices and does she feel like patient had Covid 19. Per patient mother, she don't think so but she don't know. Per pt mother she's just meanly worried about son not acting like himself. Per pt mother she don't know if it's patient Diabetes acting up because back then when patient was drinking a lot he was admitted to the hospital for his sugar. Staff informed patient mother that pt fatigue could be caused by any different things. Staff informed patient mother that in patient chart, it looks like 3 months ago patient was suppose to have his thyroids checked. Per pt mother, when patient went there to get it check, patient say the machine he had to go into and freaked out and did not get it done. Staff Informed patient mother that patient will have to call office to let staff know what's going on because these are her point of view and staff need to hear from patient as to what's going on with him and how he feels. Staff informed patient mother, although she scheduled that appt for patient, in the end patient needs to be the one that let office know what's going on. Per pt mother, if it was left up to patient, he will not say anything but she just know her child is not acting like himself. Per pt mother she will have patient call the office and let staff know what's going on before his appt for June 11th.

## 2019-04-01 NOTE — Telephone Encounter (Signed)
She may want to go with her son and have him seen at the ED if he is not acting like his usual self/having altered mental status

## 2019-04-02 NOTE — Telephone Encounter (Signed)
Per mother, patient states he is feeling fine today.   Discussed with mother options for COVID-19 testing other than the ED. Discussed appointment to return to office for an OV and to have thyroid scan.  Mother states her son seems back to himself today. He is taking his medication everyday and has refills.  Would take him to the  ED if he continues to be altered.

## 2019-04-09 ENCOUNTER — Ambulatory Visit: Payer: Medicaid Other | Attending: Family Medicine | Admitting: Family Medicine

## 2019-04-09 ENCOUNTER — Other Ambulatory Visit: Payer: Self-pay

## 2019-06-01 MED FILL — CETIRIZINE HCL 10 MG TABS: 10 | 30 days supply | Qty: 30 | Fill #2

## 2019-06-01 MED FILL — ARIPiprazole 10 MG TABS: 10 | 19 days supply | Qty: 22 | Fill #2

## 2019-07-20 MED FILL — ARIPiprazole 10 MG TABS: 10 | 16 days supply | Qty: 24 | Fill #3

## 2019-07-20 MED FILL — CETIRIZINE HCL 10 MG TABLET: 10 | 30 days supply | Qty: 30 | Fill #3

## 2019-08-05 ENCOUNTER — Encounter (HOSPITAL_COMMUNITY): Payer: Self-pay | Admitting: Family Medicine

## 2019-08-05 ENCOUNTER — Emergency Department (HOSPITAL_COMMUNITY): Payer: Medicaid Other

## 2019-08-05 ENCOUNTER — Emergency Department (HOSPITAL_COMMUNITY)
Admission: EM | Admit: 2019-08-05 | Discharge: 2019-08-05 | Disposition: A | Discharge status: Home / Self Care | Payer: Medicaid Other | Attending: Emergency Medicine | Admitting: Emergency Medicine

## 2019-08-05 ENCOUNTER — Other Ambulatory Visit: Payer: Self-pay

## 2019-08-05 DIAGNOSIS — Y999 Unspecified external cause status: Secondary | ICD-10-CM | POA: Insufficient documentation

## 2019-08-05 DIAGNOSIS — Y9389 Activity, other specified: Secondary | ICD-10-CM | POA: Insufficient documentation

## 2019-08-05 DIAGNOSIS — F17210 Nicotine dependence, cigarettes, uncomplicated: Secondary | ICD-10-CM | POA: Diagnosis not present

## 2019-08-05 DIAGNOSIS — S01122A Laceration with foreign body of left eyelid and periocular area, initial encounter: Secondary | ICD-10-CM | POA: Insufficient documentation

## 2019-08-05 DIAGNOSIS — T148XXA Other injury of unspecified body region, initial encounter: Secondary | ICD-10-CM | POA: Insufficient documentation

## 2019-08-05 DIAGNOSIS — S0181XA Laceration without foreign body of other part of head, initial encounter: Secondary | ICD-10-CM

## 2019-08-05 DIAGNOSIS — Z23 Encounter for immunization: Secondary | ICD-10-CM | POA: Diagnosis not present

## 2019-08-05 DIAGNOSIS — T07XXXA Unspecified multiple injuries, initial encounter: Secondary | ICD-10-CM

## 2019-08-05 DIAGNOSIS — Z79899 Other long term (current) drug therapy: Secondary | ICD-10-CM | POA: Diagnosis not present

## 2019-08-05 DIAGNOSIS — Y929 Unspecified place or not applicable: Secondary | ICD-10-CM | POA: Diagnosis not present

## 2019-08-05 MED ORDER — TETANUS-DIPHTH-ACELL PERTUSSIS 5-2.5-18.5 LF-MCG/0.5 IM SUSP
0.5000 mL | Freq: Once | INTRAMUSCULAR | Status: AC
  Administered 2019-08-05: 08:00:00 0.5 mL via INTRAMUSCULAR
  Filled 2019-08-05: qty 0.5

## 2019-08-05 MED ORDER — IBUPROFEN 600 MG PO TABS: 600.0000 mg | ORAL_TABLET | Freq: Four times a day (QID) | ORAL | 0 refills | Status: DC | PRN

## 2019-08-05 MED ORDER — LIDOCAINE-EPINEPHRINE 2 %-1:100000 IJ SOLN
20.0000 mL | Freq: Once | INTRAMUSCULAR | Status: AC
  Administered 2019-08-05: 20 mL
  Filled 2019-08-05: qty 1

## 2019-08-05 MED ORDER — BACITRACIN ZINC 500 UNIT/GM EX OINT
TOPICAL_OINTMENT | Freq: Two times a day (BID) | CUTANEOUS | Status: DC
  Administered 2019-08-05: 10:00:00 via TOPICAL
  Filled 2019-08-05: qty 1.8
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
## 2019-08-05 NOTE — Discharge Instructions (Signed)
Please have your sutures remove in 3-5 days.  Cleanse abrasions and wound daily and apply neosporin to wound to decrease risk of infection.  Take ibuprofen as needed for pain.
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
## 2019-08-05 NOTE — ED Notes (Signed)
Patient transported to CT 

## 2019-09-02 ENCOUNTER — Ambulatory Visit: Payer: Medicaid Other | Admitting: Pharmacist

## 2019-09-05 ENCOUNTER — Encounter (HOSPITAL_COMMUNITY): Payer: Self-pay | Admitting: Emergency Medicine

## 2019-09-05 ENCOUNTER — Emergency Department (HOSPITAL_COMMUNITY)
Admission: EM | Admit: 2019-09-05 | Discharge: 2019-09-05 | Disposition: A | Discharge status: Home / Self Care | Payer: Medicaid Other | Attending: Emergency Medicine | Admitting: Emergency Medicine

## 2019-09-05 ENCOUNTER — Other Ambulatory Visit: Payer: Self-pay

## 2019-09-05 DIAGNOSIS — Z4802 Encounter for removal of sutures: Secondary | ICD-10-CM | POA: Insufficient documentation

## 2019-09-05 DIAGNOSIS — Z5321 Procedure and treatment not carried out due to patient leaving prior to being seen by health care provider: Secondary | ICD-10-CM | POA: Diagnosis not present

## 2019-09-05 NOTE — ED Triage Notes (Addendum)
Patient wants stitches taken out above left eye brow-placed 2 weeks ago

## 2019-09-19 ENCOUNTER — Encounter (HOSPITAL_COMMUNITY): Payer: Self-pay

## 2019-09-19 ENCOUNTER — Other Ambulatory Visit: Payer: Self-pay

## 2019-09-19 ENCOUNTER — Emergency Department (HOSPITAL_COMMUNITY)
Admission: EM | Admit: 2019-09-19 | Discharge: 2019-09-19 | Disposition: A | Discharge status: Home / Self Care | Payer: Medicaid Other | Attending: Emergency Medicine | Admitting: Emergency Medicine

## 2019-09-19 DIAGNOSIS — S01112D Laceration without foreign body of left eyelid and periocular area, subsequent encounter: Secondary | ICD-10-CM | POA: Insufficient documentation

## 2019-09-19 DIAGNOSIS — Z4802 Encounter for removal of sutures: Secondary | ICD-10-CM | POA: Insufficient documentation

## 2019-09-19 DIAGNOSIS — X58XXXD Exposure to other specified factors, subsequent encounter: Secondary | ICD-10-CM | POA: Diagnosis not present

## 2019-09-19 DIAGNOSIS — F17210 Nicotine dependence, cigarettes, uncomplicated: Secondary | ICD-10-CM | POA: Diagnosis not present

## 2019-09-19 DIAGNOSIS — Z79899 Other long term (current) drug therapy: Secondary | ICD-10-CM | POA: Insufficient documentation

## 2019-09-19 MED ORDER — BACITRACIN ZINC 500 UNIT/GM EX OINT
TOPICAL_OINTMENT | Freq: Two times a day (BID) | CUTANEOUS | Status: DC
  Administered 2019-09-19: 1 via TOPICAL
  Filled 2019-09-19: qty 0.9

## 2019-09-19 NOTE — ED Triage Notes (Signed)
Pt here requesting stiches to be taken out, above left eyebrow. Reports they were placed approx one month ago.

## 2019-09-19 NOTE — ED Provider Notes (Signed)
[facility]-EMERGENCY DEPT Provider Note   CSN: [id number] Arrival date & time: 09/19/19  4098     History   Chief Complaint Chief Complaint  Patient presents with  . Suture / Staple Removal    HPI William Mayer is a 28 y.o. male.     HPI Patient presents for removal of sutures.  Sutures were placed over a month ago.  States there is still some mild pain at the area.  No fevers or drainage. Past Medical History:  Diagnosis Date  . Allergic rhinitis     Patient Active Problem List   Diagnosis Date Noted  . Chronic bilateral low back pain without sciatica 06/17/2017  . 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 NO PAST SURGERIES          Home Medications    Prior to Admission medications   Medication Sig Start Date End Date Taking? Authorizing Provider  ARIPiprazole (ABILIFY) 10 MG tablet Take 10 mg by mouth daily. 07/20/19   [provider]  cetirizine (ZYRTEC) 10 MG tablet Take 1 tablet (10 mg total) by mouth daily. 09/09/18   Fulp, Cammie, MD  ibuprofen (ADVIL) 600 MG tablet Take 1 tablet (600 mg total) by mouth every 6 (six) hours as needed for moderate pain. 08/05/19   Fayrene Helper, PA-C  naproxen sodium (ALEVE) 220 MG tablet Take 440 mg by mouth 2 (two) times daily as needed (pain/headache).    [provider]  fluticasone (FLONASE) 50 MCG/ACT nasal spray Place 2 sprays into both nostrils daily. Patient not taking: Reported on 08/05/2019 09/09/18 08/05/19  Fulp, Cammie, MD  OLANZapine (ZYPREXA) 20 MG tablet Take 1 tablet (20 mg total) by mouth daily. Schedule an OV for additional RF's Patient not taking: Reported on 08/05/2019 09/24/18 08/05/19  Cain Saupe, MD    Family History Family History  Problem Relation Age of Onset  . Gout Father   . Arthritis Father     Social History Social History   Tobacco Use  . Smoking status: Light Tobacco Smoker   Packs/day: 0.25    Types: Cigarettes  . Smokeless tobacco: Never Used  . Tobacco comment: 1 or 2  Substance Use Topics  . Alcohol use: Yes    Comment: Daily. Last drink: last night   . Drug use: Yes    Types: Marijuana    Comment: Last used: yesterday      Allergies   Amoxicillin   Review of Systems Review of Systems  Constitutional: Negative for fever.  Skin: Positive for wound.     Physical Exam Updated Vital Signs BP 138/65 (BP Location: Left Arm)   Pulse 64   Temp 99.3 F (37.4 C) (Oral)   Resp 18   Ht 5\' 6"  (1.676 m)   Wt 59 kg   SpO2 100%   BMI 20.98 kg/m   Physical Exam Vitals signs reviewed.  HENT:     Head:     Comments: Well-healing wound in left eyebrow with 7 Prolene sutures Neurological:     Mental Status: He is alert. Mental status is at baseline.      ED Treatments / Results  Labs (all labs ordered are listed, but only abnormal results are displayed) Labs Reviewed - No data to display  EKG None  Radiology No results found.  Procedures Procedures (including critical care time)  Medications Ordered in ED Medications  bacitracin ointment (1 application Topical Given  09/19/19 0848)     Initial Impression / Assessment and Plan / ED Course  I have reviewed the triage vital signs and the nursing notes.  Pertinent labs & imaging results that were available during my care of the patient were reviewed by me and considered in my medical decision making (see chart for details).        Patient with sutures above left eye.  7 sutures of 5-0 Prolene were removed.  Discharge home.  Final Clinical Impressions(s) / ED Diagnoses   Final diagnoses:  Visit for suture removal    ED Discharge Orders    None       Davonna Belling, MD 09/19/19 [phone]

## 2020-02-08 ENCOUNTER — Other Ambulatory Visit: Payer: Self-pay | Admitting: Family Medicine

## 2020-02-08 DIAGNOSIS — J309 Allergic rhinitis, unspecified: Secondary | ICD-10-CM

## 2020-02-16 ENCOUNTER — Other Ambulatory Visit: Payer: Self-pay

## 2020-02-16 ENCOUNTER — Ambulatory Visit: Payer: Medicaid Other | Attending: Internal Medicine | Admitting: Internal Medicine

## 2020-02-16 ENCOUNTER — Encounter: Payer: Self-pay | Admitting: Internal Medicine

## 2020-02-16 DIAGNOSIS — J301 Allergic rhinitis due to pollen: Secondary | ICD-10-CM | POA: Diagnosis not present

## 2020-02-16 MED ORDER — FLUTICASONE PROPIONATE 50 MCG/ACT NA SUSP: 1.0000 | Freq: Every day | NASAL | 1 refills | Status: DC

## 2020-02-16 MED ORDER — LORATADINE 10 MG PO TABS: 10.0000 mg | ORAL_TABLET | Freq: Every day | ORAL | 2 refills | Status: DC

## 2020-02-16 MED FILL — FLUTICASONE PROP 50 MCG SPR: 50 | 30 days supply | Qty: 16 | Fill #0

## 2020-02-16 MED FILL — LORATADINE 10 MG TABLET: 10 | 30 days supply | Qty: 30 | Fill #0

## 2020-02-16 NOTE — Progress Notes (Signed)
Virtual Visit via Telephone Note Due to current restrictions/limitations of in-office visits due to the COVID-19 pandemic, this scheduled clinical appointment was converted to a telehealth visit  I connected with William Mayer on 02/16/20 at 10:30 AM EDT by telephone and verified that I am speaking with the correct person using two identifiers. I am in my office.  The patient is at home.  Only the patient, his mom and myself participated in this encounter.  I discussed the limitations, risks, security and privacy concerns of performing an evaluation and management service by telephone and the availability of in person appointments. I also discussed with the patient that there may be a patient responsible charge related to this service. The patient expressed understanding and agreed to proceed.   History of Present Illness: Pt with hx of tobacco dep, substance use and allergic rhinitis.  Purpose of today's visit is urgent care.  PCP is Dr. Jillyn Hidden.  Patient is a very poor historian and I had a difficult time getting the history from him.  Pt c/o stuffy nose, sneezing, itchy watery eyes x2 months.  Denies any facial pain.  No discolored mucus from the nose.  No fever.  No recent sick contacts.  He is not using any medications for his symptoms.   Observations/Objective: No direct observation done as this was a telephone encounter.  Assessment and Plan: 1. Seasonal allergic rhinitis due to pollen Symptoms consistent with allergic rhinitis.  I recommend Claritin and Flonase nasal spray. - loratadine (CLARITIN) 10 MG tablet; Take 1 tablet (10 mg total) by mouth daily.  Dispense: 30 tablet; Refill: 2 - fluticasone (FLONASE) 50 MCG/ACT nasal spray; Place 1 spray into both nostrils daily.  Dispense: 16 g; Refill: 1   Follow Up Instructions: As needed.   I discussed the assessment and treatment plan with the patient. The patient was provided an opportunity to ask questions and all were answered. The  patient agreed with the plan and demonstrated an understanding of the instructions.   The patient was advised to call back or seek an in-person evaluation if the symptoms worsen or if the condition fails to improve as anticipated.  I provided 12 minutes of non-face-to-face time during this encounter.   Jonah Blue, MD

## 2020-03-15 ENCOUNTER — Other Ambulatory Visit: Payer: Self-pay | Admitting: Family Medicine

## 2020-03-15 ENCOUNTER — Telehealth: Payer: Self-pay | Admitting: Family Medicine

## 2020-03-15 DIAGNOSIS — M79672 Pain in left foot: Secondary | ICD-10-CM

## 2020-03-15 DIAGNOSIS — M79671 Pain in right foot: Secondary | ICD-10-CM

## 2020-03-15 NOTE — Progress Notes (Signed)
Patient ID: William Mayer, male   DOB: 08-13-1991, 29 y.o.   MRN: [id number]   Patient's mother left a message that patient needs referral to see Dr. [facility] due to issues with a blister on his heel and possible ingrowning toenail. Referral placed

## 2020-03-15 NOTE — Telephone Encounter (Signed)
Per pt mother due to patient's medical problem, he's putting shoes on that's too small,put 3 socks on and walking a long way and now he has a blister on his heel about the size of a quarter that is now busted. Per pt mother they took him to urgent care ([facility] on [address]) but they could not do anything about it. Per pt mother they did not give him anything. Per pt she put antibiotic cream on it and covered it. Per pt the sore is not open but its not healing and he can not wear shoes. Per pt mother he has something going on with his toes as well. Per pt she do not know if its the right or let but the toe nails grows in curve, but now the toe nail is curving into his skin and the toes needs to be looked at as well.   Per pt that's why she needs a referral.

## 2020-03-15 NOTE — Telephone Encounter (Signed)
Notify patient's mother that referral was placed

## 2020-03-15 NOTE — Telephone Encounter (Signed)
Patients mother called in and requested for a referral to go to Dr [facility]. Please follow up at your earliest convenience.

## 2020-03-15 NOTE — Telephone Encounter (Signed)
Can you contact the patient's mother and find out what type of foot or ankle issues the patient is having as this information will be needed for the referral

## 2020-03-16 NOTE — Telephone Encounter (Signed)
Informed patient mother with information and she verbalized understanding.  

## 2020-04-26 ENCOUNTER — Emergency Department (HOSPITAL_COMMUNITY): Payer: Medicaid Other

## 2020-04-26 ENCOUNTER — Emergency Department (HOSPITAL_COMMUNITY)
Admission: EM | Admit: 2020-04-26 | Discharge: 2020-04-27 | Disposition: A | Discharge status: Home / Self Care | Payer: Medicaid Other | Attending: Emergency Medicine | Admitting: Emergency Medicine

## 2020-04-26 DIAGNOSIS — Z79899 Other long term (current) drug therapy: Secondary | ICD-10-CM | POA: Diagnosis not present

## 2020-04-26 DIAGNOSIS — F1010 Alcohol abuse, uncomplicated: Secondary | ICD-10-CM

## 2020-04-26 DIAGNOSIS — Y904 Blood alcohol level of 80-99 mg/100 ml: Secondary | ICD-10-CM | POA: Diagnosis not present

## 2020-04-26 DIAGNOSIS — F10120 Alcohol abuse with intoxication, uncomplicated: Secondary | ICD-10-CM | POA: Insufficient documentation

## 2020-04-26 DIAGNOSIS — R569 Unspecified convulsions: Secondary | ICD-10-CM

## 2020-04-26 DIAGNOSIS — G4089 Other seizures: Secondary | ICD-10-CM | POA: Diagnosis not present

## 2020-04-26 DIAGNOSIS — I959 Hypotension, unspecified: Secondary | ICD-10-CM | POA: Diagnosis not present

## 2020-04-26 DIAGNOSIS — F17210 Nicotine dependence, cigarettes, uncomplicated: Secondary | ICD-10-CM | POA: Insufficient documentation

## 2020-04-26 LAB — I-STAT CHEM 8, ED
BUN: 15 mg/dL (ref 6–20)
Calcium, Ion: 1.15 mmol/L (ref 1.15–1.40)
Chloride: 102 mmol/L (ref 98–111)
Creatinine, Ser: 1.5 mg/dL — ABNORMAL HIGH (ref 0.61–1.24)
Glucose, Bld: 108 mg/dL — ABNORMAL HIGH (ref 70–99)
HCT: 38 % — ABNORMAL LOW (ref 39.0–52.0)
Hemoglobin: 12.9 g/dL — ABNORMAL LOW (ref 13.0–17.0)
Potassium: 3.2 mmol/L — ABNORMAL LOW (ref 3.5–5.1)
Sodium: 140 mmol/L (ref 135–145)
TCO2: 20 mmol/L — ABNORMAL LOW (ref 22–32)

## 2020-04-26 LAB — ETHANOL: Alcohol, Ethyl (B): 97 mg/dL — ABNORMAL HIGH (ref <10)

## 2020-04-26 LAB — CBC WITH DIFFERENTIAL/PLATELET
Abs Immature Granulocytes: 0.02 10*3/uL (ref 0.00–0.07)
Basophils Absolute: 0 10*3/uL (ref 0.0–0.1)
Basophils Relative: 0 %
Eosinophils Absolute: 0.1 10*3/uL (ref 0.0–0.5)
Eosinophils Relative: 1 %
HCT: 40.8 % (ref 39.0–52.0)
Hemoglobin: 13.1 g/dL (ref 13.0–17.0)
Immature Granulocytes: 0 %
Lymphocytes Relative: 30 %
Lymphs Abs: 1.8 10*3/uL (ref 0.7–4.0)
MCH: 32.7 pg (ref 26.0–34.0)
MCHC: 32.1 g/dL (ref 30.0–36.0)
MCV: 101.7 fL — ABNORMAL HIGH (ref 80.0–100.0)
Monocytes Absolute: 0.4 10*3/uL (ref 0.1–1.0)
Monocytes Relative: 7 %
Neutro Abs: 3.7 10*3/uL (ref 1.7–7.7)
Neutrophils Relative %: 62 %
Platelets: 205 10*3/uL (ref 150–400)
RBC: 4.01 MIL/uL — ABNORMAL LOW (ref 4.22–5.81)
RDW: 11.5 % (ref 11.5–15.5)
WBC: 6.1 10*3/uL (ref 4.0–10.5)
nRBC: 0 % (ref 0.0–0.2)

## 2020-04-26 LAB — COMPREHENSIVE METABOLIC PANEL
ALT: 15 U/L (ref 0–44)
AST: 20 U/L (ref 15–41)
Albumin: 3.2 g/dL — ABNORMAL LOW (ref 3.5–5.0)
Alkaline Phosphatase: 53 U/L (ref 38–126)
Anion gap: 12 (ref 5–15)
BUN: 15 mg/dL (ref 6–20)
CO2: 19 mmol/L — ABNORMAL LOW (ref 22–32)
Calcium: 8.3 mg/dL — ABNORMAL LOW (ref 8.9–10.3)
Chloride: 105 mmol/L (ref 98–111)
Creatinine, Ser: 1.24 mg/dL (ref 0.61–1.24)
GFR calc Af Amer: >60 mL/min (ref >60)
GFR calc non Af Amer: >60 mL/min (ref >60)
Glucose, Bld: 112 mg/dL — ABNORMAL HIGH (ref 70–99)
Potassium: 3.2 mmol/L — ABNORMAL LOW (ref 3.5–5.1)
Sodium: 136 mmol/L (ref 135–145)
Total Bilirubin: 0.5 mg/dL (ref 0.3–1.2)
Total Protein: 6.5 g/dL (ref 6.5–8.1)

## 2020-04-26 LAB — PROTIME-INR
INR: 1.1 (ref 0.8–1.2)
Prothrombin Time: 13.4 seconds (ref 11.4–15.2)

## 2020-04-26 LAB — MAGNESIUM: Magnesium: 1.7 mg/dL (ref 1.7–2.4)

## 2020-04-26 MED ORDER — LEVETIRACETAM IN NACL 1000 MG/100ML IV SOLN
1000.0000 mg | Freq: Once | INTRAVENOUS | Status: AC
  Administered 2020-04-26: 1000 mg via INTRAVENOUS
  Filled 2020-04-26: qty 100

## 2020-04-26 MED ORDER — SODIUM CHLORIDE 0.9 % IV BOLUS
1000.0000 mL | Freq: Once | INTRAVENOUS | Status: AC
  Administered 2020-04-26: 22:00:00 1000 mL via INTRAVENOUS
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
## 2020-04-27 ENCOUNTER — Emergency Department (HOSPITAL_COMMUNITY): Payer: Medicaid Other

## 2020-04-27 LAB — RAPID URINE DRUG SCREEN, HOSP PERFORMED
Amphetamines: POSITIVE — AB
Barbiturates: NOT DETECTED
Benzodiazepines: POSITIVE — AB
Cocaine: POSITIVE — AB
Opiates: NOT DETECTED
Tetrahydrocannabinol: POSITIVE — AB

## 2020-04-27 LAB — URINALYSIS, ROUTINE W REFLEX MICROSCOPIC
Bilirubin Urine: NEGATIVE
Glucose, UA: NEGATIVE mg/dL
Hgb urine dipstick: NEGATIVE
Ketones, ur: NEGATIVE mg/dL
Leukocytes,Ua: NEGATIVE
Nitrite: NEGATIVE
Protein, ur: NEGATIVE mg/dL
Specific Gravity, Urine: 1.012 (ref 1.005–1.030)
pH: 5 (ref 5.0–8.0)

## 2020-04-27 MED ORDER — LEVETIRACETAM 500 MG PO TABS
500.0000 mg | ORAL_TABLET | Freq: Two times a day (BID) | ORAL | 1 refills | Status: DC
Start: 2020-04-27 — End: 2021-01-23

## 2020-04-27 MED ORDER — IOHEXOL 300 MG/ML  SOLN
100.0000 mL | Freq: Once | INTRAMUSCULAR | Status: AC | PRN
  Administered 2020-04-27: 100 mL via INTRAVENOUS

## 2020-04-27 MED FILL — levETIRAcetam 500 MG TABS: 500 | 30 days supply | Qty: 60 | Fill #0

## 2020-04-27 NOTE — ED Notes (Signed)
Pt ambulatory with no impairment to gait upon departure.   No slurred speech.

## 2020-04-27 NOTE — Discharge Instructions (Addendum)
You were seen today for seizure-like activity.  You need to follow-up with neurology.  Given your ongoing alcohol abuse, you could have had a alcohol withdrawal seizure.  However, you will need to get neurology evaluation and EEG.  You will be started on Keppra.  You should not drive or operate machinery until cleared by neurology.  You should not stop drinking abruptly given you drink daily.

## 2020-04-27 NOTE — ED Notes (Signed)
Pt provided with food, no complaint of N/V.
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
## 2020-05-05 ENCOUNTER — Ambulatory Visit: Payer: Medicaid Other | Admitting: Neurology

## 2020-05-05 ENCOUNTER — Ambulatory Visit: Payer: Medicaid Other | Attending: Internal Medicine

## 2020-05-05 ENCOUNTER — Other Ambulatory Visit: Payer: Self-pay

## 2020-05-05 DIAGNOSIS — F39 Unspecified mood [affective] disorder: Secondary | ICD-10-CM

## 2020-05-05 DIAGNOSIS — Z23 Encounter for immunization: Secondary | ICD-10-CM

## 2020-05-05 NOTE — Progress Notes (Signed)
   Covid-19 Vaccination Clinic  Name:  William Mayer    MRN: [id number] DOB: 1991/08/05  05/05/2020  [name] was observed post Covid-19 immunization for 15 minutes without incident. He was provided with Vaccine Information Sheet and instruction to access the V-Safe system.   [name] was instructed to call 911 with any severe reactions post vaccine: Marland Kitchen Difficulty breathing  . Swelling of face and throat  . A fast heartbeat  . A bad rash all over body  . Dizziness and weakness   Immunizations Administered    Name Date Dose VIS Date Route   Pfizer COVID-19 Vaccine 05/05/2020  8:17 AM 0.3 mL 12/23/2018 Intramuscular   Manufacturer: [employer], [employer]   Lot: [id number]   NDC: [address]-8

## 2020-05-09 NOTE — Procedures (Signed)
ELECTROENCEPHALOGRAM REPORT  Date of Study: 05/05/2020  Patient's Name: William Mayer MRN: [id number] Date of Birth: [birth year]  Referring Provider: Dr. Tilden Fossa  Clinical History: This is a 29 year old man with witnessed seizure  Medications: ABILIFY 10 MG tablet ZYRTEC 10 MG tablet FLONASE 50 MCG/ACT nasal spray ADVIL 600 MG tablet CLARITIN 10 MG tablet ALEVE 220 MG tablet   Technical Summary: A multichannel digital EEG recording measured by the international 10-20 system with electrodes applied with paste and impedances below 5000 ohms performed in our laboratory with EKG monitoring in an awake and asleep patient.  Hyperventilation was not performed. Photic stimulation was performed.  The digital EEG was referentially recorded, reformatted, and digitally filtered in a variety of bipolar and referential montages for optimal display.    Description: The patient is awake and asleep during the recording.  During maximal wakefulness, there is a symmetric, medium voltage 10-10.5 Hz posterior dominant rhythm that attenuates with eye opening.  The record is symmetric.  During drowsiness and sleep, there is an increase in theta slowing of the background.  Vertex waves and symmetric sleep spindles were seen.  Photic stimulation did not elicit any abnormalities.  There were no epileptiform discharges or electrographic seizures seen.    EKG lead was unremarkable, sinus rhythm at 60 bpm.  Impression: This awake and asleep EEG is normal.    Clinical Correlation: A normal EEG does not exclude a clinical diagnosis of epilepsy.  If further clinical questions remain, prolonged EEG may be helpful.  Clinical correlation is advised.   Patrcia Dolly, M.D.
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
## 2020-05-12 ENCOUNTER — Ambulatory Visit: Payer: Medicaid Other | Attending: Physician Assistant | Admitting: Physician Assistant

## 2020-05-12 ENCOUNTER — Other Ambulatory Visit: Payer: Self-pay

## 2020-05-12 ENCOUNTER — Encounter: Payer: Self-pay | Admitting: Physician Assistant

## 2020-05-12 VITALS — BP 113/66 | HR 77 | Temp 97.7°F | Wt 134.0 lb

## 2020-05-12 DIAGNOSIS — F1210 Cannabis abuse, uncomplicated: Secondary | ICD-10-CM | POA: Diagnosis not present

## 2020-05-12 DIAGNOSIS — Z09 Encounter for follow-up examination after completed treatment for conditions other than malignant neoplasm: Secondary | ICD-10-CM

## 2020-05-12 DIAGNOSIS — E876 Hypokalemia: Secondary | ICD-10-CM | POA: Diagnosis not present

## 2020-05-12 DIAGNOSIS — Z79899 Other long term (current) drug therapy: Secondary | ICD-10-CM | POA: Insufficient documentation

## 2020-05-12 DIAGNOSIS — R739 Hyperglycemia, unspecified: Secondary | ICD-10-CM | POA: Diagnosis not present

## 2020-05-12 DIAGNOSIS — R569 Unspecified convulsions: Secondary | ICD-10-CM

## 2020-05-12 DIAGNOSIS — F1410 Cocaine abuse, uncomplicated: Secondary | ICD-10-CM | POA: Diagnosis not present

## 2020-05-12 DIAGNOSIS — Z791 Long term (current) use of non-steroidal anti-inflammatories (NSAID): Secondary | ICD-10-CM | POA: Insufficient documentation

## 2020-05-12 DIAGNOSIS — F1910 Other psychoactive substance abuse, uncomplicated: Secondary | ICD-10-CM

## 2020-05-12 DIAGNOSIS — F1510 Other stimulant abuse, uncomplicated: Secondary | ICD-10-CM | POA: Insufficient documentation

## 2020-05-12 DIAGNOSIS — M545 Low back pain: Secondary | ICD-10-CM | POA: Diagnosis not present

## 2020-05-12 DIAGNOSIS — G8929 Other chronic pain: Secondary | ICD-10-CM | POA: Diagnosis not present

## 2020-05-12 DIAGNOSIS — J301 Allergic rhinitis due to pollen: Secondary | ICD-10-CM | POA: Insufficient documentation

## 2020-05-12 DIAGNOSIS — Z880 Allergy status to penicillin: Secondary | ICD-10-CM | POA: Diagnosis not present

## 2020-05-12 DIAGNOSIS — J309 Allergic rhinitis, unspecified: Secondary | ICD-10-CM

## 2020-05-12 MED ORDER — IBUPROFEN 600 MG PO TABS: 600.0000 mg | ORAL_TABLET | Freq: Four times a day (QID) | ORAL | 1 refills | Status: DC | PRN

## 2020-05-12 MED ORDER — CETIRIZINE HCL 10 MG PO TABS: 10.0000 mg | ORAL_TABLET | Freq: Every day | ORAL | 11 refills | Status: DC

## 2020-05-12 MED ORDER — FLUTICASONE PROPIONATE 50 MCG/ACT NA SUSP: 1.0000 | Freq: Every day | NASAL | 11 refills | Status: DC

## 2020-05-12 MED FILL — LORATADINE 10 MG TABLET: 10 | 30 days supply | Qty: 30 | Fill #0

## 2020-05-12 MED FILL — IBUPROFEN 600 MG TABLET: 600 | 15 days supply | Qty: 60 | Fill #0

## 2020-05-12 MED FILL — FLUTICASONE PROP 50 MCG SPR: 50 | 30 days supply | Qty: 16 | Fill #0

## 2020-05-12 NOTE — Patient Instructions (Addendum)
[email or web address] is the website for AA/12 step recovery.   There is also [email or web address] for NA/12 step meetings  Call neurologist to ask about seizure medication

## 2020-05-13 LAB — BASIC METABOLIC PANEL
BUN/Creatinine Ratio: 9 (ref 9–20)
BUN: 10 mg/dL (ref 6–20)
CO2: 20 mmol/L (ref 20–29)
Calcium: 9.7 mg/dL (ref 8.7–10.2)
Chloride: 102 mmol/L (ref 96–106)
Creatinine, Ser: 1.1 mg/dL (ref 0.76–1.27)
GFR calc Af Amer: 103 mL/min/{1.73_m2} (ref >59)
GFR calc non Af Amer: 89 mL/min/{1.73_m2} (ref >59)
Glucose: 69 mg/dL (ref 65–99)
Potassium: 4.2 mmol/L (ref 3.5–5.2)
Sodium: 138 mmol/L (ref 134–144)

## 2020-05-13 LAB — HEMOGLOBIN A1C
Est. average glucose Bld gHb Est-mCnc: 108 mg/dL
Hgb A1c MFr Bld: 5.4 % (ref 4.8–5.6)

## 2020-05-26 ENCOUNTER — Ambulatory Visit: Payer: Medicaid Other | Attending: Internal Medicine

## 2020-05-26 DIAGNOSIS — Z23 Encounter for immunization: Secondary | ICD-10-CM

## 2020-05-26 NOTE — Progress Notes (Signed)
   Covid-19 Vaccination Clinic  Name:  William Mayer    MRN: [id number] DOB: Jul 01, 1991  05/26/2020  Mr. William Mayer was observed post Covid-19 immunization for 15 minutes without incident. He was provided with Vaccine Information Sheet and instruction to access the V-Safe system.   Mr. William Mayer was instructed to call 911 with any severe reactions post vaccine: Marland Kitchen Difficulty breathing  . Swelling of face and throat  . A fast heartbeat  . A bad rash all over body  . Dizziness and weakness   Immunizations Administered    Name Date Dose VIS Date Route   Pfizer COVID-19 Vaccine 05/26/2020  8:35 AM 0.3 mL 12/23/2018 Intramuscular   Manufacturer: [employer], [employer]   Lot: [id number]   NDC: [address]-6

## 2020-05-27 ENCOUNTER — Ambulatory Visit: Payer: Medicaid Other | Admitting: Nurse Practitioner

## 2020-07-13 MED FILL — IBUPROFEN 600 MG TABLET: 600 | 15 days supply | Qty: 60 | Fill #1

## 2020-07-13 MED FILL — FLUTICASONE PROP 50 MCG SPR: 50 | 30 days supply | Qty: 16 | Fill #1

## 2021-01-13 ENCOUNTER — Encounter (HOSPITAL_COMMUNITY): Payer: Self-pay | Admitting: *Deleted

## 2021-01-13 ENCOUNTER — Other Ambulatory Visit: Payer: Self-pay

## 2021-01-13 ENCOUNTER — Emergency Department (HOSPITAL_COMMUNITY)
Admission: EM | Admit: 2021-01-13 | Discharge: 2021-01-13 | Discharge status: Against Medical Advice | Payer: Medicaid Other | Attending: Emergency Medicine | Admitting: Emergency Medicine

## 2021-01-13 ENCOUNTER — Ambulatory Visit (HOSPITAL_COMMUNITY): Payer: Medicaid Other

## 2021-01-13 DIAGNOSIS — F10129 Alcohol abuse with intoxication, unspecified: Secondary | ICD-10-CM | POA: Diagnosis not present

## 2021-01-13 DIAGNOSIS — E162 Hypoglycemia, unspecified: Secondary | ICD-10-CM | POA: Insufficient documentation

## 2021-01-13 DIAGNOSIS — S01511A Laceration without foreign body of lip, initial encounter: Secondary | ICD-10-CM | POA: Insufficient documentation

## 2021-01-13 DIAGNOSIS — X58XXXA Exposure to other specified factors, initial encounter: Secondary | ICD-10-CM | POA: Insufficient documentation

## 2021-01-13 DIAGNOSIS — F10920 Alcohol use, unspecified with intoxication, uncomplicated: Secondary | ICD-10-CM

## 2021-01-13 DIAGNOSIS — F17210 Nicotine dependence, cigarettes, uncomplicated: Secondary | ICD-10-CM | POA: Insufficient documentation

## 2021-01-13 DIAGNOSIS — S00501A Unspecified superficial injury of lip, initial encounter: Secondary | ICD-10-CM | POA: Diagnosis present

## 2021-01-13 LAB — COMPREHENSIVE METABOLIC PANEL
ALT: 22 U/L (ref 0–44)
AST: 52 U/L — ABNORMAL HIGH (ref 15–41)
Albumin: 4.3 g/dL (ref 3.5–5.0)
Alkaline Phosphatase: 65 U/L (ref 38–126)
Anion gap: 16 — ABNORMAL HIGH (ref 5–15)
BUN: 19 mg/dL (ref 6–20)
CO2: 20 mmol/L — ABNORMAL LOW (ref 22–32)
Calcium: 8.8 mg/dL — ABNORMAL LOW (ref 8.9–10.3)
Chloride: 102 mmol/L (ref 98–111)
Creatinine, Ser: 1.37 mg/dL — ABNORMAL HIGH (ref 0.61–1.24)
GFR, Estimated: >60 mL/min (ref >60)
Glucose, Bld: 86 mg/dL (ref 70–99)
Potassium: 4 mmol/L (ref 3.5–5.1)
Sodium: 138 mmol/L (ref 135–145)
Total Bilirubin: 1.2 mg/dL (ref 0.3–1.2)
Total Protein: 7.5 g/dL (ref 6.5–8.1)

## 2021-01-13 LAB — CBC
HCT: 46.2 % (ref 39.0–52.0)
Hemoglobin: 14.9 g/dL (ref 13.0–17.0)
MCH: 32.7 pg (ref 26.0–34.0)
MCHC: 32.3 g/dL (ref 30.0–36.0)
MCV: 101.3 fL — ABNORMAL HIGH (ref 80.0–100.0)
Platelets: 230 10*3/uL (ref 150–400)
RBC: 4.56 MIL/uL (ref 4.22–5.81)
RDW: 12.5 % (ref 11.5–15.5)
WBC: 11.7 10*3/uL — ABNORMAL HIGH (ref 4.0–10.5)
nRBC: 0 % (ref 0.0–0.2)

## 2021-01-13 LAB — CBG MONITORING, ED
Glucose-Capillary: 57 mg/dL — ABNORMAL LOW (ref 70–99)
Glucose-Capillary: 121 mg/dL — ABNORMAL HIGH (ref 70–99)

## 2021-01-13 LAB — ETHANOL: Alcohol, Ethyl (B): 226 mg/dL — ABNORMAL HIGH (ref <10)

## 2021-01-13 MED ORDER — DEXTROSE 50 % IV SOLN
INTRAVENOUS | Status: AC
  Administered 2021-01-13: 50 mL
  Filled 2021-01-13: qty 50

## 2021-01-13 MED ORDER — THIAMINE HCL 100 MG/ML IJ SOLN
100.0000 mg | Freq: Once | INTRAMUSCULAR | Status: AC
  Administered 2021-01-13: 100 mg via INTRAVENOUS
  Filled 2021-01-13: qty 2

## 2021-01-13 NOTE — ED Notes (Signed)
This tech asked pt to provide a urine sample and attempted to hand patient a urine cup. Patient told this tech he did not need to give a urine sample, patient states "you dont need any of my samples back away before I take that fucking bag and throw it at you." This tech asked patient not shout cuss words and left the urine cup with patient.

## 2021-01-13 NOTE — ED Notes (Signed)
Pt is requesting to leave. Provider made aware; provider at bedside speaking to pt.

## 2021-01-13 NOTE — ED Notes (Signed)
Pt refusing to provide urine sample.
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
## 2021-01-13 NOTE — ED Provider Notes (Incomplete)
[facility] EMERGENCY DEPARTMENT Provider Note   CSN: [id number] Arrival date & time: 01/13/21  1232     History Chief Complaint  Patient presents with  . Alcohol Intoxication    William Mayer is a 30 y.o. male with a past medical history of psychotic affective disorder, alcohol use, who presents today for evaluation after he was reportedly found behind a [facility] intoxicated with a lip wound. Originally with EMS his CBG was 53.  He was given oral glucose by EMS along with D10 with improvement.  Chart review shows that in the past he has been brought to the emergency room hypoglycemic and intoxicated with alcohol.  This does not appear to be new.    Chart review shoes his Tdap is UTD.    Patient refuses to answer many of my questions.    He will not tell me when or how he hurt his lip.    When asked he denies SI, HI, or AVH.  He states that he doesn't want to talk to me.   Level 5 caveat patient not cooperative  HPI     Past Medical History:  Diagnosis Date  . Allergic rhinitis     Patient Active Problem List   Diagnosis Date Noted  . Chronic bilateral low back pain without sciatica 06/17/2017  . 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 NO PAST SURGERIES         Family History  Problem Relation Age of Onset  . Gout Father   . Arthritis Father     Social History   Tobacco Use  . Smoking status: Light Tobacco Smoker    Packs/day: 0.25    Types: Cigarettes  . Smokeless tobacco: Never Used  . Tobacco comment: 1 or 2  Vaping Use  . Vaping Use: Never used  Substance Use Topics  . Alcohol use: Yes    Comment: Daily. Last drink: last night   . Drug use: Yes    Types: Marijuana    Comment: Last used: yesterday     Home Medications Prior to Admission medications   Medication Sig Start Date End Date Taking? Authorizing Provider  ARIPiprazole (ABILIFY) 10 MG tablet Take  10 mg by mouth daily. 07/20/19   [provider]  cetirizine (ZYRTEC) 10 MG tablet Take 1 tablet (10 mg total) by mouth daily. 05/12/20   Anders Simmonds, PA-C  fluticasone (FLONASE) 50 MCG/ACT nasal spray Place 1 spray into both nostrils daily. 05/12/20   Anders Simmonds, PA-C  ibuprofen (ADVIL) 600 MG tablet Take 1 tablet (600 mg total) by mouth every 6 (six) hours as needed for moderate pain. 05/12/20   Anders Simmonds, PA-C  levETIRAcetam (KEPPRA) 500 MG tablet Take 1 tablet (500 mg total) by mouth 2 (two) times daily. 04/27/20   Horton, Mayer Masker, MD  loratadine (CLARITIN) 10 MG tablet Take 1 tablet (10 mg total) by mouth daily. 02/16/20   Marcine Matar, MD  naproxen sodium (ALEVE) 220 MG tablet Take 440 mg by mouth 2 (two) times daily as needed (pain/headache).    [provider]  OLANZapine (ZYPREXA) 20 MG tablet Take 1 tablet (20 mg total) by mouth daily. Schedule an OV for additional RF's Patient not taking: Reported on 08/05/2019 09/24/18 08/05/19  Cain Saupe, MD    Allergies    Amoxicillin  Review of Systems   Review of Systems  Unable to perform ROS:  Other  Patient choosing not to answer my questions  Physical Exam Updated Vital Signs BP 133/81   Pulse 81   Temp 97.7 F (36.5 C) (Oral)   Resp 19   SpO2 98%   Physical Exam Vitals and nursing note reviewed.  Constitutional:      General: He is not in acute distress.    Appearance: He is not diaphoretic.  HENT:     Head: Normocephalic.     Mouth/Throat:     Comments: There is a superficial 1cm wound on the lower lip, primarily on the right side.  Patient declines additional exam.  Eyes:     General: No scleral icterus.       Right eye: No discharge.        Left eye: No discharge.     Conjunctiva/sclera: Conjunctivae normal.  Cardiovascular:     Rate and Rhythm: Normal rate and regular rhythm.  Pulmonary:     Effort: Pulmonary effort is normal. No respiratory distress.     Breath sounds:  No stridor.  Abdominal:     General: There is no distension.  Musculoskeletal:        General: No deformity.     Cervical back: Normal range of motion and neck supple.  Skin:    General: Skin is warm and dry.  Neurological:     Mental Status: He is alert.     Motor: No abnormal muscle tone.     Comments: Patient is awake and alert.  He has a normal gait without ataxia.  He is able to open food items to eat without weakness or obvious coordination deficits.  Facial movements are symmetric.  Speech is not slurred.  Psychiatric:     Comments: Flat, withdrawn, denies SI, HI, AVH.  Does not appear to be responding to internal stimuli.       ED Results / Procedures / Treatments   Labs (all labs ordered are listed, but only abnormal results are displayed) Labs Reviewed  COMPREHENSIVE METABOLIC PANEL - Abnormal; Notable for the following components:      Result Value   CO2 20 (*)    Creatinine, Ser 1.37 (*)    Calcium 8.8 (*)    AST 52 (*)    Anion gap 16 (*)    All other components within normal limits  [name] - Abnormal; Notable for the following components:   Alcohol, Ethyl (B) 226 (*)    All other components within normal limits  CBC - Abnormal; Notable for the following components:   WBC 11.7 (*)    MCV 101.3 (*)    All other components within normal limits  CBG MONITORING, ED - Abnormal; Notable for the following components:   Glucose-Capillary 57 (*)    All other components within normal limits  CBG MONITORING, ED - Abnormal; Notable for the following components:   Glucose-Capillary 121 (*)    All other components within normal limits  RAPID URINE DRUG SCREEN, HOSP PERFORMED    EKG None  Radiology No results found.  Procedures Procedures {Remember to document critical care time when appropriate:1}  Medications Ordered in ED Medications  thiamine (B-1) injection 100 mg (100 mg Intravenous Given 01/13/21 1832)  dextrose 50 % solution (50 mLs  Given 01/13/21 1830)     ED Course  I have reviewed the triage vital signs and the nursing notes.  Pertinent labs & imaging results that were available during my care of the patient were reviewed by me and  considered in my medical decision making (see chart for details).  Clinical Course as of 01/13/21 2320  Fri Jan 13, 2021  1945 Glucose-Capillary(!): 38 Patient given 2 cups of apple juice, [place] sandwich, cheese sticks, crackers.  Plan to monitor and reassess. [EH]  2113 I was informed that patient wished to leave.  I discussed with patient that he has not had his head scan yet.  He does not wish to stay for these.  He has been here for almost 9 hours and while he has a flat affect appears to be clinically sober.  He is oriented to person, place and time and will not tell me why he needs to leave.   [EH]    Clinical Course User Index [EH] Norman Clay   MDM Rules/Calculators/A&P                         Patient is a 30 year old man who presents today for evaluation of alcohol intoxication.  When I first saw patient he had been in the department in the waiting room for about 7 hours. His labs obtained from triage show his alcohol is elevated at 226.  Creatinine is slightly elevated at 1.37 with an anion gap of 16 which I suspect is secondary to alcohol intoxication.  With EMS when he was hypoglycemic he was given oral glucose and IV D10 however it does not appear that he was given any complex sugars. Patient was noted to be hypoglycemic again here with a blood sugar of 57.  This was treated with D50 and IV thiamine given his alcohol use.  Additionally he was provided with complex sugars and allowed to eat after which his blood sugar was rechecked and was 121.  Patient's tetanus is up-to-date. He has a approximately centimeter superficial wound on his lip however he refuses further physical exam.  He states that he does not want to talk with me and refuses to answer the majority of my  questions. He did state that he is not suicidal, homicidal, and denies AVH.  While he has a flat affect he does not appear to be responding to internal stimuli.  Given that patient had normal blood sugar at the time of his decision to leave and was ambulatory, able to answer orientation questions appropriately and was simply stating he did not want to talk to me while denying SI, HI, or AVH he does not meet criteria for involuntary commitment at this time.  He appears to be able to make his own decisions. His ethanol was elevated however clinically he appears sober and he had approximately 9 hours to metabolize in the emergency room before he chose to leave. I did discuss with him obtaining imaging however patient chose to leave.  We discussed that him leaving right now was AGAINST MEDICAL ADVICE, and he stated his understanding.  We discussed risks of making this decision including, but not limited to, death, disability that may be severe, recurrent hypoglycemia, infection, and he is able to state these risks back to me and still wishes to leave. Patient walked out of the emergency room in no obvious distress.   Note: Portions of this report may have been transcribed using voice recognition software. Every effort was made to ensure accuracy; however, inadvertent computerized transcription errors may be present  *** Final Clinical Impression(s) / ED Diagnoses Final diagnoses:  None    Rx / DC Orders ED Discharge Orders    None

## 2021-01-13 NOTE — ED Provider Notes (Signed)
[facility] EMERGENCY DEPARTMENT Provider Note   CSN: [id number] Arrival date & time: 01/13/21  1232     History Chief Complaint  Patient presents with  . Alcohol Intoxication    William Mayer is a 30 y.o. male with a past medical history of psychotic affective disorder, alcohol use, who presents today for evaluation after he was reportedly found behind a [employer] intoxicated with a lip wound. Originally with EMS his CBG was 53.  He was given oral glucose by EMS along with D10 with improvement.  Chart review shows that in the past he has been brought to the emergency room hypoglycemic and intoxicated with alcohol.  This does not appear to be new.    Chart review shoes his Tdap is UTD.    Patient refuses to answer many of my questions.    He will not tell me when or how he hurt his lip.    When asked he denies SI, HI, or AVH.  He states that he doesn't want to talk to me.   Level 5 caveat patient not cooperative  HPI     Past Medical History:  Diagnosis Date  . Allergic rhinitis     Patient Active Problem List   Diagnosis Date Noted  . Chronic bilateral low back pain without sciatica 06/17/2017  . 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 NO PAST SURGERIES         Family History  Problem Relation Age of Onset  . Gout Father   . Arthritis Father     Social History   Tobacco Use  . Smoking status: Light Tobacco Smoker    Packs/day: 0.25    Types: Cigarettes  . Smokeless tobacco: Never Used  . Tobacco comment: 1 or 2  Vaping Use  . Vaping Use: Never used  Substance Use Topics  . Alcohol use: Yes    Comment: Daily. Last drink: last night   . Drug use: Yes    Types: Marijuana    Comment: Last used: yesterday     Home Medications Prior to Admission medications   Medication Sig Start Date End Date Taking? Authorizing Provider  ARIPiprazole (ABILIFY) 10 MG tablet Take  10 mg by mouth daily. 07/20/19   [provider]  cetirizine (ZYRTEC) 10 MG tablet Take 1 tablet (10 mg total) by mouth daily. 05/12/20   Anders Simmonds, PA-C  fluticasone (FLONASE) 50 MCG/ACT nasal spray Place 1 spray into both nostrils daily. 05/12/20   Anders Simmonds, PA-C  ibuprofen (ADVIL) 600 MG tablet Take 1 tablet (600 mg total) by mouth every 6 (six) hours as needed for moderate pain. 05/12/20   Anders Simmonds, PA-C  levETIRAcetam (KEPPRA) 500 MG tablet Take 1 tablet (500 mg total) by mouth 2 (two) times daily. 04/27/20   Horton, Mayer Masker, MD  loratadine (CLARITIN) 10 MG tablet Take 1 tablet (10 mg total) by mouth daily. 02/16/20   Marcine Matar, MD  naproxen sodium (ALEVE) 220 MG tablet Take 440 mg by mouth 2 (two) times daily as needed (pain/headache).    [provider]  OLANZapine (ZYPREXA) 20 MG tablet Take 1 tablet (20 mg total) by mouth daily. Schedule an OV for additional RF's Patient not taking: Reported on 08/05/2019 09/24/18 08/05/19  Cain Saupe, MD    Allergies    Amoxicillin  Review of Systems   Review of Systems  Unable to perform ROS:  Other  Patient choosing not to answer my questions  Physical Exam Updated Vital Signs BP 133/81   Pulse 81   Temp 97.7 F (36.5 C) (Oral)   Resp 19   SpO2 98%   Physical Exam Vitals and nursing note reviewed.  Constitutional:      General: He is not in acute distress.    Appearance: He is not diaphoretic.  HENT:     Head: Normocephalic.     Mouth/Throat:     Comments: There is a superficial 1cm wound on the lower lip, primarily on the right side.  Patient declines additional exam.  Eyes:     General: No scleral icterus.       Right eye: No discharge.        Left eye: No discharge.     Conjunctiva/sclera: Conjunctivae normal.  Cardiovascular:     Rate and Rhythm: Normal rate and regular rhythm.  Pulmonary:     Effort: Pulmonary effort is normal. No respiratory distress.     Breath sounds:  No stridor.  Abdominal:     General: There is no distension.  Musculoskeletal:        General: No deformity.     Cervical back: Normal range of motion and neck supple.  Skin:    General: Skin is warm and dry.  Neurological:     Mental Status: He is alert.     Motor: No abnormal muscle tone.     Comments: Patient is awake and alert.  He has a normal gait without ataxia.  He is able to open food items to eat without weakness or obvious coordination deficits.  Facial movements are symmetric.  Speech is not slurred.  Psychiatric:     Comments: Flat, withdrawn, denies SI, HI, AVH.  Does not appear to be responding to internal stimuli.       ED Results / Procedures / Treatments   Labs (all labs ordered are listed, but only abnormal results are displayed) Labs Reviewed  COMPREHENSIVE METABOLIC PANEL - Abnormal; Notable for the following components:      Result Value   CO2 20 (*)    Creatinine, Ser 1.37 (*)    Calcium 8.8 (*)    AST 52 (*)    Anion gap 16 (*)    All other components within normal limits  [name] - Abnormal; Notable for the following components:   Alcohol, Ethyl (B) 226 (*)    All other components within normal limits  CBC - Abnormal; Notable for the following components:   WBC 11.7 (*)    MCV 101.3 (*)    All other components within normal limits  CBG MONITORING, ED - Abnormal; Notable for the following components:   Glucose-Capillary 57 (*)    All other components within normal limits  CBG MONITORING, ED - Abnormal; Notable for the following components:   Glucose-Capillary 121 (*)    All other components within normal limits  RAPID URINE DRUG SCREEN, HOSP PERFORMED    EKG None  Radiology No results found.  Procedures .Critical Care Performed by: Cristina Gong, PA-C Authorized by: Cristina Gong, PA-C   Critical care provider statement:    Critical care time (minutes):  45   Critical care time was exclusive of:  Separately billable  procedures and treating other patients and teaching time   Critical care was necessary to treat or prevent imminent or life-threatening deterioration of the following conditions:  Endocrine crisis   Critical care was time  spent personally by me on the following activities:  Discussions with consultants, evaluation of patient's response to treatment, examination of patient, ordering and performing treatments and interventions, ordering and review of laboratory studies, ordering and review of radiographic studies, pulse oximetry, re-evaluation of patient's condition, obtaining history from patient or surrogate and review of old charts Comments:     Hypoglycemia requiring D50     Medications Ordered in ED Medications  thiamine (B-1) injection 100 mg (100 mg Intravenous Given 01/13/21 1832)  dextrose 50 % solution (50 mLs  Given 01/13/21 1830)    ED Course  I have reviewed the triage vital signs and the nursing notes.  Pertinent labs & imaging results that were available during my care of the patient were reviewed by me and considered in my medical decision making (see chart for details).  Clinical Course as of 01/14/21 0032  Fri Jan 13, 2021  1945 Glucose-Capillary(!): 57 Patient given 2 cups of apple juice, [place] sandwich, cheese sticks, crackers.  Plan to monitor and reassess. [EH]  2113 I was informed that patient wished to leave.  I discussed with patient that he has not had his head scan yet.  He does not wish to stay for these.  He has been here for almost 9 hours and while he has a flat affect appears to be clinically sober.  He is oriented to person, place and time and will not tell me why he needs to leave.   [EH]    Clinical Course User Index [EH] Norman Clay   MDM Rules/Calculators/A&P                         Patient is a 30 year old man who presents today for evaluation of alcohol intoxication.  When I first saw patient he had been in the department in the  waiting room for about 7 hours. His labs obtained from triage show his alcohol is elevated at 226.  Creatinine is slightly elevated at 1.37 with an anion gap of 16 which I suspect is secondary to alcohol intoxication.  With EMS when he was hypoglycemic he was given oral glucose and IV D10 however it does not appear that he was given any complex sugars. Patient was noted to be hypoglycemic again here with a blood sugar of 57.  This was treated with D50 and IV thiamine given his alcohol use.  Additionally he was provided with complex sugars and allowed to eat after which his blood sugar was rechecked and was 121.  Patient's tetanus is up-to-date. He has a approximately centimeter superficial wound on his lip however he refuses further physical exam.  He states that he does not want to talk with me and refuses to answer the majority of my questions. He did state that he is not suicidal, homicidal, and denies AVH.  While he has a flat affect he does not appear to be responding to internal stimuli.  Given that patient had normal blood sugar at the time of his decision to leave and was ambulatory, able to answer orientation questions appropriately and was simply stating he did not want to talk to me while denying SI, HI, or AVH he does not meet criteria for involuntary commitment at this time.  He appears to be able to make his own decisions. His ethanol was elevated however clinically he appears sober and he had approximately 9 hours to metabolize in the emergency room before he chose to leave. I  did discuss with him obtaining imaging however patient chose to leave.  We discussed that him leaving right now was AGAINST MEDICAL ADVICE, and he stated his understanding.  We discussed risks of making this decision including, but not limited to, death, disability that may be severe, recurrent hypoglycemia, infection, and he is able to state these risks back to me and still wishes to leave. Patient walked out of  the emergency room in no obvious distress.   Note: Portions of this report may have been transcribed using voice recognition software. Every effort was made to ensure accuracy; however, inadvertent computerized transcription errors may be present   Final Clinical Impression(s) / ED Diagnoses Final diagnoses:  Alcoholic intoxication without complication (HCC)  Hypoglycemia    Rx / DC Orders ED Discharge Orders    None       Norman Clay 01/14/21 0032    Terald Sleeper, MD 01/14/21 1249
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
## 2021-01-23 ENCOUNTER — Ambulatory Visit: Payer: Medicaid Other | Attending: Critical Care Medicine | Admitting: Critical Care Medicine

## 2021-01-23 ENCOUNTER — Encounter: Payer: Self-pay | Admitting: Critical Care Medicine

## 2021-01-23 ENCOUNTER — Other Ambulatory Visit: Payer: Self-pay

## 2021-01-23 VITALS — BP 135/65 | HR 63 | Ht 66.0 in | Wt 124.0 lb

## 2021-01-23 DIAGNOSIS — G4089 Other seizures: Secondary | ICD-10-CM | POA: Diagnosis not present

## 2021-01-23 DIAGNOSIS — Z880 Allergy status to penicillin: Secondary | ICD-10-CM | POA: Diagnosis not present

## 2021-01-23 DIAGNOSIS — Z760 Encounter for issue of repeat prescription: Secondary | ICD-10-CM | POA: Insufficient documentation

## 2021-01-23 DIAGNOSIS — K219 Gastro-esophageal reflux disease without esophagitis: Secondary | ICD-10-CM | POA: Insufficient documentation

## 2021-01-23 DIAGNOSIS — J302 Other seasonal allergic rhinitis: Secondary | ICD-10-CM | POA: Diagnosis not present

## 2021-01-23 DIAGNOSIS — Z79899 Other long term (current) drug therapy: Secondary | ICD-10-CM | POA: Insufficient documentation

## 2021-01-23 DIAGNOSIS — F1020 Alcohol dependence, uncomplicated: Secondary | ICD-10-CM | POA: Insufficient documentation

## 2021-01-23 DIAGNOSIS — J309 Allergic rhinitis, unspecified: Secondary | ICD-10-CM | POA: Diagnosis not present

## 2021-01-23 DIAGNOSIS — F10288 Alcohol dependence with other alcohol-induced disorder: Secondary | ICD-10-CM | POA: Insufficient documentation

## 2021-01-23 DIAGNOSIS — J301 Allergic rhinitis due to pollen: Secondary | ICD-10-CM | POA: Insufficient documentation

## 2021-01-23 DIAGNOSIS — R569 Unspecified convulsions: Secondary | ICD-10-CM | POA: Insufficient documentation

## 2021-01-23 DIAGNOSIS — F17210 Nicotine dependence, cigarettes, uncomplicated: Secondary | ICD-10-CM | POA: Diagnosis not present

## 2021-01-23 DIAGNOSIS — F39 Unspecified mood [affective] disorder: Secondary | ICD-10-CM

## 2021-01-23 DIAGNOSIS — Z09 Encounter for follow-up examination after completed treatment for conditions other than malignant neoplasm: Secondary | ICD-10-CM | POA: Insufficient documentation

## 2021-01-23 DIAGNOSIS — M545 Low back pain, unspecified: Secondary | ICD-10-CM | POA: Insufficient documentation

## 2021-01-23 DIAGNOSIS — G8929 Other chronic pain: Secondary | ICD-10-CM | POA: Diagnosis not present

## 2021-01-23 HISTORY — DX: Unspecified convulsions: R56.9

## 2021-01-23 MED ORDER — FLUTICASONE PROPIONATE 50 MCG/ACT NA SUSP
1.0000 | Freq: Every day | NASAL | 11 refills | Status: DC
  Filled 2021-06-29: qty 16, 25d supply, fill #0

## 2021-01-23 MED ORDER — IBUPROFEN 600 MG PO TABS
600.0000 mg | ORAL_TABLET | Freq: Four times a day (QID) | ORAL | 1 refills | Status: DC | PRN
  Filled 2021-06-29: qty 60, 15d supply, fill #0

## 2021-01-23 MED ORDER — PANTOPRAZOLE SODIUM 40 MG PO TBEC
40.0000 mg | DELAYED_RELEASE_TABLET | Freq: Every day | ORAL | 3 refills | Status: DC
  Filled 2021-04-10: qty 30, 30d supply, fill #0
  Filled 2021-06-29: qty 30, 30d supply, fill #1

## 2021-01-23 MED ORDER — CETIRIZINE HCL 10 MG PO TABS
10.0000 mg | ORAL_TABLET | Freq: Every day | ORAL | 11 refills | Status: DC
  Filled 2021-04-10: qty 30, 30d supply, fill #0
  Filled 2021-06-29: qty 30, 30d supply, fill #1

## 2021-01-23 MED ORDER — ARIPIPRAZOLE 10 MG PO TABS
10.0000 mg | ORAL_TABLET | Freq: Every day | ORAL | 2 refills | Status: DC
  Filled 2021-06-29: qty 40, 40d supply, fill #0

## 2021-01-23 NOTE — Assessment & Plan Note (Signed)
Psychoactive affective disorder Previously on Abilify Will refill Abilify Have made referral to psychiatry at the [facility] for follow-up

## 2021-01-23 NOTE — Assessment & Plan Note (Signed)
Chronic low back pain we will refill ibuprofen

## 2021-01-23 NOTE — Assessment & Plan Note (Signed)
Alcohol use disorder on a severe basis  Referral made to psychiatry

## 2021-01-23 NOTE — Patient Instructions (Signed)
Please make an appointment at your [facility] to obtain your Pfizer booster vaccine for Covid or you can call the number below to get an appointment at a [place] site: COVID-19 Vaccine Information can be found at: [email or web address] For questions related to vaccine distribution or appointments, please email [email or web address] or call [phone].   Refills on your Flonase cetirizine ibuprofen and Abilify were sent to your [facility] pharmacy also give you a prescription for pantoprazole to take 1 daily before breakfast to help with stomach acid  Please reduce the amount of alcohol intake you are currently taking in  Return to see Dr. Delford Field in 3 months sooner if necessary  I did send a referral to the behavioral health clinic at [facility] for follow-up with your history of seizures and being on the Abilify, I did refill the Abilify today but having a mental health doctor as part of your care team would be beneficial

## 2021-01-23 NOTE — Assessment & Plan Note (Signed)
Seasonal allergic rhinitis   Refill Flonase and Zyrtec

## 2021-01-23 NOTE — Assessment & Plan Note (Signed)
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

## 2021-01-23 NOTE — Progress Notes (Signed)
Having trouble with allergies needs medication refills.

## 2021-01-23 NOTE — Assessment & Plan Note (Signed)
Reflux disease exacerbated by alcohol use  Begin pantoprazole daily

## 2021-02-02 ENCOUNTER — Encounter: Payer: Medicaid Other | Admitting: Podiatry

## 2021-02-02 DIAGNOSIS — M778 Other enthesopathies, not elsewhere classified: Secondary | ICD-10-CM

## 2021-02-09 ENCOUNTER — Ambulatory Visit (INDEPENDENT_AMBULATORY_CARE_PROVIDER_SITE_OTHER): Payer: Medicaid Other

## 2021-02-09 ENCOUNTER — Other Ambulatory Visit: Payer: Self-pay

## 2021-02-09 ENCOUNTER — Encounter: Payer: Self-pay | Admitting: Podiatry

## 2021-02-09 ENCOUNTER — Ambulatory Visit (INDEPENDENT_AMBULATORY_CARE_PROVIDER_SITE_OTHER): Payer: Medicaid Other | Admitting: Podiatry

## 2021-02-09 DIAGNOSIS — M722 Plantar fascial fibromatosis: Secondary | ICD-10-CM | POA: Diagnosis not present

## 2021-02-09 NOTE — Progress Notes (Signed)
  Subjective:  Patient ID: William Mayer, male    DOB: October 12, 1991,  MRN: [id number]  Chief Complaint  Patient presents with  . Foot Pain    Bilateral foot pain     30 y.o. male presents with the above complaint.  Patient presents with complaint bilateral heel pain.  Patient states is painful to walk on.  He states that he has not tried anything for it.  He has not seen anyone else prior to see me.  Is sharp shooting in the heel.  Hurts with every ambulation.  He does not wear good shoes.  He is ambulating today with flip-flops.  He denies any other treatment options.   Review of Systems: Negative except as noted in the HPI. Denies N/V/F/Ch.  Past Medical History:  Diagnosis Date  . Allergic rhinitis   . Seizure ([facility]) 01/23/2021    Current Outpatient Medications:  .  ARIPiprazole (ABILIFY) 10 MG tablet, Take 1 tablet (10 mg total) by mouth daily., Disp: 40 tablet, Rfl: 2 .  cetirizine (ZYRTEC) 10 MG tablet, Take 1 tablet (10 mg total) by mouth daily., Disp: 30 tablet, Rfl: 11 .  fluticasone (FLONASE) 50 MCG/ACT nasal spray, Place 1 spray into both nostrils daily., Disp: 16 g, Rfl: 11 .  ibuprofen (ADVIL) 600 MG tablet, Take 1 tablet (600 mg total) by mouth every 6 (six) hours as needed for moderate pain., Disp: 60 tablet, Rfl: 1 .  pantoprazole (PROTONIX) 40 MG tablet, Take 1 tablet (40 mg total) by mouth daily., Disp: 30 tablet, Rfl: 3  Social History   Tobacco Use  Smoking Status Light Tobacco Smoker  . Packs/day: 0.25  . Types: Cigarettes  Smokeless Tobacco Never Used  Tobacco Comment   1 or 2    Allergies  Allergen Reactions  . Amoxicillin Hives and Itching   Objective:  There were no vitals filed for this visit. There is no height or weight on file to calculate BMI. Constitutional Well developed. Well nourished.  Vascular Dorsalis pedis pulses palpable bilaterally. Posterior tibial pulses palpable bilaterally. Capillary refill normal to all digits.  No cyanosis  or clubbing noted. Pedal hair growth normal.  Neurologic Normal speech. Oriented to person, place, and time. Epicritic sensation to light touch grossly present bilaterally.  Dermatologic Nails well groomed and normal in appearance. No open wounds. No skin lesions.  Orthopedic: Normal joint ROM without pain or crepitus bilaterally. No visible deformities. Tender to palpation at the calcaneal tuber bilaterally. No pain with calcaneal squeeze bilaterally. Ankle ROM diminished range of motion bilaterally. Silfverskiold Test: negative bilaterally.   Radiographs: Taken and reviewed. No acute fractures or dislocations. No evidence of stress fracture.  Plantar heel spur absent. Posterior heel spur absent.   Assessment:   1. Plantar fasciitis of right foot   2. Plantar fasciitis of left foot    Plan:  Patient was evaluated and treated and all questions answered.  Plantar Fasciitis, bilaterally - XR reviewed as above.  - Educated on icing and stretching. Instructions given.  - Injection delivered to the plantar fascia as below. - DME: None - Pharmacologic management: None  Procedure: Injection Tendon/Ligament Location: Bilateral plantar fascia at the glabrous junction; medial approach. Skin Prep: alcohol Injectate: 0.5 cc 0.5% marcaine plain, 0.5 cc of 1% Lidocaine, 0.5 cc kenalog 10. Disposition: Patient tolerated procedure well. Injection site dressed with a band-aid.  No follow-ups on file.

## 2021-02-21 NOTE — Progress Notes (Signed)
This encounter was created in error - please disregard.

## 2021-03-24 ENCOUNTER — Ambulatory Visit: Payer: Medicaid Other | Admitting: Podiatry

## 2021-04-07 ENCOUNTER — Other Ambulatory Visit: Payer: Self-pay

## 2021-04-07 ENCOUNTER — Ambulatory Visit (INDEPENDENT_AMBULATORY_CARE_PROVIDER_SITE_OTHER): Payer: Medicaid Other | Admitting: Podiatry

## 2021-04-07 DIAGNOSIS — M722 Plantar fascial fibromatosis: Secondary | ICD-10-CM

## 2021-04-10 ENCOUNTER — Other Ambulatory Visit: Payer: Self-pay

## 2021-04-10 ENCOUNTER — Telehealth: Payer: Self-pay | Admitting: Critical Care Medicine

## 2021-04-10 NOTE — Telephone Encounter (Signed)
Pt's mother called in to request a refill for 2 medications for pt.   pantoprazole (PROTONIX) 40 MG tablet  cetirizine (ZYRTEC) 10 MG tablet    Pharmacy:   [facility] Phone:  [phone]  Fax:  [phone]

## 2021-04-10 NOTE — Telephone Encounter (Signed)
These are at the [facility] at [facility]. I called our pharmacy and talked to [facility]. We are going to transfer these from them to [place].

## 2021-04-11 ENCOUNTER — Other Ambulatory Visit: Payer: Self-pay

## 2021-04-12 ENCOUNTER — Encounter: Payer: Self-pay | Admitting: Podiatry

## 2021-04-12 NOTE — Progress Notes (Signed)
  Subjective:  Patient ID: William Mayer, male    DOB: September 20, 1991,  MRN: [id number]  Chief Complaint  Patient presents with   Foot Pain    PT stated that he is still having pain with his feet     30 y.o. male presents with the above complaint.  Patient presents with follow-up of bilateral plantar fasciitis.  Patient states is about 50 to 60% better.  He still having some pain in both of the heels.  He would like to do if he can do more steroid shots.  He denies any other acute complaints.  He has not made any shoe gear modification   Review of Systems: Negative except as noted in the HPI. Denies N/V/F/Ch.  Past Medical History:  Diagnosis Date   Allergic rhinitis    Seizure ([facility]) 01/23/2021    Current Outpatient Medications:    ARIPiprazole (ABILIFY) 10 MG tablet, Take 1 tablet (10 mg total) by mouth daily., Disp: 40 tablet, Rfl: 2   cetirizine (ZYRTEC) 10 MG tablet, Take 1 tablet (10 mg total) by mouth daily., Disp: 30 tablet, Rfl: 11   fluticasone (FLONASE) 50 MCG/ACT nasal spray, Place 1 spray into both nostrils daily., Disp: 16 g, Rfl: 11   ibuprofen (ADVIL) 600 MG tablet, Take 1 tablet (600 mg total) by mouth every 6 (six) hours as needed for moderate pain., Disp: 60 tablet, Rfl: 1   pantoprazole (PROTONIX) 40 MG tablet, Take 1 tablet (40 mg total) by mouth daily., Disp: 30 tablet, Rfl: 3  Social History   Tobacco Use  Smoking Status Light Smoker   Packs/day: 0.25   Pack years: 0.00   Types: Cigarettes  Smokeless Tobacco Never  Tobacco Comments   1 or 2    Allergies  Allergen Reactions   Amoxicillin Hives and Itching   Objective:  There were no vitals filed for this visit. There is no height or weight on file to calculate BMI. Constitutional Well developed. Well nourished.  Vascular Dorsalis pedis pulses palpable bilaterally. Posterior tibial pulses palpable bilaterally. Capillary refill normal to all digits.  No cyanosis or clubbing noted. Pedal hair growth  normal.  Neurologic Normal speech. Oriented to person, place, and time. Epicritic sensation to light touch grossly present bilaterally.  Dermatologic Nails well groomed and normal in appearance. No open wounds. No skin lesions.  Orthopedic: Normal joint ROM without pain or crepitus bilaterally. No visible deformities. Tender to palpation at the calcaneal tuber bilaterally. No pain with calcaneal squeeze bilaterally. Ankle ROM diminished range of motion bilaterally. Silfverskiold Test: negative bilaterally.   Radiographs: Taken and reviewed. No acute fractures or dislocations. No evidence of stress fracture.  Plantar heel spur absent. Posterior heel spur absent.   Assessment:   1. Plantar fasciitis of right foot   2. Plantar fasciitis of left foot     Plan:  Patient was evaluated and treated and all questions answered.  Plantar Fasciitis, bilaterally - XR reviewed as above.  - Educated on icing and stretching. Instructions given.  -Second injection delivered to the plantar fascia as below. - DME: None - Pharmacologic management: None  Procedure: Injection Tendon/Ligament Location: Bilateral plantar fascia at the glabrous junction; medial approach. Skin Prep: alcohol Injectate: 0.5 cc 0.5% marcaine plain, 0.5 cc of 1% Lidocaine, 0.5 cc kenalog 10. Disposition: Patient tolerated procedure well. Injection site dressed with a band-aid.  No follow-ups on file.

## 2021-04-27 ENCOUNTER — Other Ambulatory Visit: Payer: Self-pay

## 2021-04-27 ENCOUNTER — Emergency Department (HOSPITAL_COMMUNITY): Payer: Medicaid Other

## 2021-04-27 ENCOUNTER — Emergency Department (HOSPITAL_COMMUNITY)
Admission: EM | Admit: 2021-04-27 | Discharge: 2021-04-27 | Disposition: A | Discharge status: Home / Self Care | Payer: Medicaid Other | Attending: Emergency Medicine | Admitting: Emergency Medicine

## 2021-04-27 DIAGNOSIS — F1490 Cocaine use, unspecified, uncomplicated: Secondary | ICD-10-CM | POA: Insufficient documentation

## 2021-04-27 DIAGNOSIS — F1290 Cannabis use, unspecified, uncomplicated: Secondary | ICD-10-CM | POA: Insufficient documentation

## 2021-04-27 DIAGNOSIS — Y902 Blood alcohol level of 40-59 mg/100 ml: Secondary | ICD-10-CM | POA: Insufficient documentation

## 2021-04-27 DIAGNOSIS — F17210 Nicotine dependence, cigarettes, uncomplicated: Secondary | ICD-10-CM | POA: Insufficient documentation

## 2021-04-27 DIAGNOSIS — R7309 Other abnormal glucose: Secondary | ICD-10-CM | POA: Diagnosis not present

## 2021-04-27 DIAGNOSIS — F10929 Alcohol use, unspecified with intoxication, unspecified: Secondary | ICD-10-CM | POA: Diagnosis not present

## 2021-04-27 DIAGNOSIS — R4182 Altered mental status, unspecified: Secondary | ICD-10-CM | POA: Diagnosis present

## 2021-04-27 DIAGNOSIS — F10920 Alcohol use, unspecified with intoxication, uncomplicated: Secondary | ICD-10-CM

## 2021-04-27 LAB — CBC WITH DIFFERENTIAL/PLATELET
Abs Immature Granulocytes: 0.01 10*3/uL (ref 0.00–0.07)
Basophils Absolute: 0 10*3/uL (ref 0.0–0.1)
Basophils Relative: 0 %
Eosinophils Absolute: 0 10*3/uL (ref 0.0–0.5)
Eosinophils Relative: 1 %
HCT: 41.5 % (ref 39.0–52.0)
Hemoglobin: 13.7 g/dL (ref 13.0–17.0)
Immature Granulocytes: 0 %
Lymphocytes Relative: 29 %
Lymphs Abs: 1.4 10*3/uL (ref 0.7–4.0)
MCH: 33.2 pg (ref 26.0–34.0)
MCHC: 33 g/dL (ref 30.0–36.0)
MCV: 100.5 fL — ABNORMAL HIGH (ref 80.0–100.0)
Monocytes Absolute: 0.4 10*3/uL (ref 0.1–1.0)
Monocytes Relative: 8 %
Neutro Abs: 3 10*3/uL (ref 1.7–7.7)
Neutrophils Relative %: 62 %
Platelets: 199 10*3/uL (ref 150–400)
RBC: 4.13 MIL/uL — ABNORMAL LOW (ref 4.22–5.81)
RDW: 11.5 % (ref 11.5–15.5)
WBC: 4.8 10*3/uL (ref 4.0–10.5)
nRBC: 0 % (ref 0.0–0.2)

## 2021-04-27 LAB — COMPREHENSIVE METABOLIC PANEL
ALT: 13 U/L (ref 0–44)
AST: 16 U/L (ref 15–41)
Albumin: 3.7 g/dL (ref 3.5–5.0)
Alkaline Phosphatase: 45 U/L (ref 38–126)
Anion gap: 10 (ref 5–15)
BUN: 12 mg/dL (ref 6–20)
CO2: 21 mmol/L — ABNORMAL LOW (ref 22–32)
Calcium: 8.6 mg/dL — ABNORMAL LOW (ref 8.9–10.3)
Chloride: 108 mmol/L (ref 98–111)
Creatinine, Ser: 1.07 mg/dL (ref 0.61–1.24)
GFR, Estimated: >60 mL/min (ref >60)
Glucose, Bld: 96 mg/dL (ref 70–99)
Potassium: 3.8 mmol/L (ref 3.5–5.1)
Sodium: 139 mmol/L (ref 135–145)
Total Bilirubin: 0.7 mg/dL (ref 0.3–1.2)
Total Protein: 6.5 g/dL (ref 6.5–8.1)

## 2021-04-27 LAB — RAPID URINE DRUG SCREEN, HOSP PERFORMED
Amphetamines: NOT DETECTED
Barbiturates: NOT DETECTED
Benzodiazepines: NOT DETECTED
Cocaine: POSITIVE — AB
Opiates: NOT DETECTED
Tetrahydrocannabinol: POSITIVE — AB

## 2021-04-27 LAB — ETHANOL: Alcohol, Ethyl (B): 49 mg/dL — ABNORMAL HIGH (ref <10)

## 2021-04-27 LAB — CBG MONITORING, ED: Glucose-Capillary: 105 mg/dL — ABNORMAL HIGH (ref 70–99)

## 2021-04-27 MED ORDER — SODIUM CHLORIDE 0.9 % IV BOLUS
1000.0000 mL | Freq: Once | INTRAVENOUS | Status: AC
  Administered 2021-04-27: 1000 mL via INTRAVENOUS

## 2021-04-27 MED ORDER — ONDANSETRON HCL 4 MG/2ML IJ SOLN
4.0000 mg | Freq: Once | INTRAMUSCULAR | Status: AC
  Administered 2021-04-27: 4 mg via INTRAVENOUS
  Filled 2021-04-27: qty 2

## 2021-04-27 NOTE — ED Notes (Signed)
Pt asking for food upon arrival.

## 2021-04-27 NOTE — ED Notes (Signed)
Pt transported home by mother.

## 2021-04-27 NOTE — ED Triage Notes (Signed)
Ems brings pt in from home. Mom thought pt was not acting right since he got home from work today. Pt then admits to drinking liquor and beer today while working in the heat.

## 2021-04-27 NOTE — ED Provider Notes (Signed)
[facility]-EMERGENCY DEPT Provider Note   CSN: [id number] Arrival date & time: 04/27/21  1534     History Chief Complaint  Patient presents with   Altered Mental Status    William Mayer is a 30 y.o. male w PMHx severe alcohol dependence, presenting to the ED via EMS from home for AMS. It is reported in triage documentation that patient mother thought he "was not acting right" after returning home from work, but patient apparently admitted to drinking liquor and beer today.   LEVEL 5 CAVEAT 2/t AMS. Patient is arousable but not answering many questions. Endorses alcohol use but will not tell me what kind or how much. Denies drug use.   The history is provided by the patient. The history is limited by the condition of the patient.      Past Medical History:  Diagnosis Date   Allergic rhinitis    Seizure (HCC) 01/23/2021    Patient Active Problem List   Diagnosis Date Noted   Allergic rhinitis 01/23/2021   GERD (gastroesophageal reflux disease) 01/23/2021   Alcohol use disorder, severe, dependence (HCC) 01/23/2021   Chronic bilateral low back pain without sciatica 06/17/2017   Psychotic affective disorder (HCC) 03/24/2017    Past Surgical History:  Procedure Laterality Date   NO PAST SURGERIES         Family History  Problem Relation Age of Onset   Gout Father    Arthritis Father     Social History   Tobacco Use   Smoking status: Light Smoker    Packs/day: 0.25    Pack years: 0.00    Types: Cigarettes   Smokeless tobacco: Never   Tobacco comments:    1 or 2  Vaping Use   Vaping Use: Never used  Substance Use Topics   Alcohol use: Yes    Comment: Daily. Last drink: last night    Drug use: Yes    Types: Marijuana    Comment: Last used: yesterday     Home Medications Prior to Admission medications   Medication Sig Start Date End Date Taking? Authorizing Provider  ARIPiprazole (ABILIFY) 10 MG tablet Take 1 tablet (10 mg total) by  mouth daily. 01/23/21   Storm Frisk, MD  cetirizine (ZYRTEC) 10 MG tablet Take 1 tablet (10 mg total) by mouth daily. 01/23/21   Storm Frisk, MD  fluticasone (FLONASE) 50 MCG/ACT nasal spray Place 1 spray into both nostrils daily. 01/23/21   Storm Frisk, MD  ibuprofen (ADVIL) 600 MG tablet Take 1 tablet (600 mg total) by mouth every 6 (six) hours as needed for moderate pain. 01/23/21   Storm Frisk, MD  pantoprazole (PROTONIX) 40 MG tablet Take 1 tablet (40 mg total) by mouth daily. 01/23/21   Storm Frisk, MD  OLANZapine (ZYPREXA) 20 MG tablet Take 1 tablet (20 mg total) by mouth daily. Schedule an OV for additional RF's Patient not taking: Reported on 08/05/2019 09/24/18 08/05/19  Cain Saupe, MD    Allergies    Amoxicillin  Review of Systems   Review of Systems  Unable to perform ROS: Mental status change   Physical Exam Updated Vital Signs BP 108/65   Pulse 61   Temp 98.1 F (36.7 C) (Oral)   Resp 16   SpO2 100%   Physical Exam Vitals and nursing note reviewed.  Constitutional:      Appearance: He is well-developed.     Comments: Smells of alcohol  HENT:  Head: Normocephalic and atraumatic.     Comments: No visualized or palpable hematomas to the head. Eyes:     Conjunctiva/sclera: Conjunctivae normal.  Cardiovascular:     Rate and Rhythm: Normal rate and regular rhythm.  Pulmonary:     Effort: Pulmonary effort is normal. No respiratory distress.     Breath sounds: Normal breath sounds.  Abdominal:     Palpations: Abdomen is soft.  Skin:    General: Skin is warm.  Neurological:     Comments: Patient is arousable to verbal stimuli and or light touch.  Speech is slurred and mumbled.  Psychiatric:        Behavior: Behavior normal.    ED Results / Procedures / Treatments   Labs (all labs ordered are listed, but only abnormal results are displayed) Labs Reviewed  [name] - Abnormal; Notable for the following components:      Result Value    Alcohol, Ethyl (B) 49 (*)    All other components within normal limits  COMPREHENSIVE METABOLIC PANEL - Abnormal; Notable for the following components:   CO2 21 (*)    Calcium 8.6 (*)    All other components within normal limits  CBC WITH DIFFERENTIAL/PLATELET - Abnormal; Notable for the following components:   RBC 4.13 (*)    MCV 100.5 (*)    All other components within normal limits  RAPID URINE DRUG SCREEN, HOSP PERFORMED - Abnormal; Notable for the following components:   Cocaine POSITIVE (*)    Tetrahydrocannabinol POSITIVE (*)    All other components within normal limits  CBG MONITORING, ED - Abnormal; Notable for the following components:   Glucose-Capillary 105 (*)    All other components within normal limits    EKG None  Radiology CT Head Wo Contrast  Result Date: 04/27/2021 CLINICAL DATA:  30 year old male with altered mental status. EXAM: CT HEAD WITHOUT CONTRAST TECHNIQUE: Contiguous axial images were obtained from the base of the skull through the vertex without intravenous contrast. COMPARISON:  Head CT dated 04/27/2020. FINDINGS: Brain: No evidence of acute infarction, hemorrhage, hydrocephalus, extra-axial collection or mass lesion/mass effect. Vascular: No hyperdense vessel or unexpected calcification. Skull: Normal. Negative for fracture or focal lesion. Sinuses/Orbits: No acute finding. Other: None IMPRESSION: Normal noncontrast CT of the brain. Electronically Signed   By: Elgie Collard M.D.   On: 04/27/2021 19:51    Procedures Procedures   Medications Ordered in ED Medications  sodium chloride 0.9 % bolus 1,000 mL (0 mLs Intravenous Stopped 04/27/21 1910)  ondansetron (ZOFRAN) injection 4 mg (4 mg Intravenous Given 04/27/21 1654)    ED Course  I have reviewed the triage vital signs and the nursing notes.  Pertinent labs & imaging results that were available during my care of the patient were reviewed by me and considered in my medical decision making  (see chart for details).  Clinical Course as of 04/27/21 2334  Thu Apr 27, 2021  2031 Patient is more easily arousable now, he is oriented, able to provide more history. Will call his mother for anticipated d/c [JR]  2206 Patient ambulating with steady gait.  [JR]    Clinical Course User Index [JR] Haydan Mansouri, [place] N, PA-C   MDM Rules/Calculators/A&P                          Patient with hx of alcohol dependence, brought in by mother for AMS. He appears intoxicated however due to limited history and acute alteration  in mental status, will obtain some blood work for [employer], screening labs. IVF and zofran ordered.   [name] levels not remarkably high.  Due to patient's mental status, CT head is obtained and is negative.  On reevaluation he is much more conversant and alert.  With encouragement he is more talkative.  He is oriented.  He endorses alcohol use today, denies drug use.  Denies injuries today..  Requests something to eat.  He is ambulating with steady gait without assistance.  His mother arrives to take him home, believe he is appropriate for discharge to home in her care.  Final Clinical Impression(s) / ED Diagnoses Final diagnoses:  Alcoholic intoxication without complication [facility])    Rx / DC Orders ED Discharge Orders     None        Sundee Garland, [place] N, PA-C 04/27/21 2334    Wynetta Fines, MD 04/29/21 1453

## 2021-04-27 NOTE — ED Notes (Signed)
Pt ambulated to restroom without any assistance, pt ambulated with steady gait.

## 2021-05-05 ENCOUNTER — Other Ambulatory Visit: Payer: Self-pay

## 2021-05-05 ENCOUNTER — Ambulatory Visit (INDEPENDENT_AMBULATORY_CARE_PROVIDER_SITE_OTHER): Payer: Medicaid Other | Admitting: Podiatry

## 2021-05-05 DIAGNOSIS — M722 Plantar fascial fibromatosis: Secondary | ICD-10-CM

## 2021-05-10 ENCOUNTER — Encounter: Payer: Self-pay | Admitting: Podiatry

## 2021-05-10 NOTE — Progress Notes (Signed)
  Subjective:  Patient ID: William Mayer, male    DOB: 06-24-1991,  MRN: [id number]  Chief Complaint  Patient presents with   Foot Pain    PT stated that he is still having foot pain     30 y.o. male presents with the above complaint.  Patient presents with follow-up of bilateral plantar fasciitis.  Patient states that is about 80% better.  Patient states it does get a little better but still some residual pain.  He denies any other acute complaints and like to do another steroid injection.   Review of Systems: Negative except as noted in the HPI. Denies N/V/F/Ch.  Past Medical History:  Diagnosis Date   Allergic rhinitis    Seizure (HCC) 01/23/2021    Current Outpatient Medications:    ARIPiprazole (ABILIFY) 10 MG tablet, Take 1 tablet (10 mg total) by mouth daily., Disp: 40 tablet, Rfl: 2   cetirizine (ZYRTEC) 10 MG tablet, Take 1 tablet (10 mg total) by mouth daily., Disp: 30 tablet, Rfl: 11   fluticasone (FLONASE) 50 MCG/ACT nasal spray, Place 1 spray into both nostrils daily., Disp: 16 g, Rfl: 11   ibuprofen (ADVIL) 600 MG tablet, Take 1 tablet (600 mg total) by mouth every 6 (six) hours as needed for moderate pain., Disp: 60 tablet, Rfl: 1   pantoprazole (PROTONIX) 40 MG tablet, Take 1 tablet (40 mg total) by mouth daily., Disp: 30 tablet, Rfl: 3  Social History   Tobacco Use  Smoking Status Light Smoker   Packs/day: 0.25   Pack years: 0.00   Types: Cigarettes  Smokeless Tobacco Never  Tobacco Comments   1 or 2    Allergies  Allergen Reactions   Amoxicillin Hives and Itching   Objective:  There were no vitals filed for this visit. There is no height or weight on file to calculate BMI. Constitutional Well developed. Well nourished.  Vascular Dorsalis pedis pulses palpable bilaterally. Posterior tibial pulses palpable bilaterally. Capillary refill normal to all digits.  No cyanosis or clubbing noted. Pedal hair growth normal.  Neurologic Normal  speech. Oriented to person, place, and time. Epicritic sensation to light touch grossly present bilaterally.  Dermatologic Nails well groomed and normal in appearance. No open wounds. No skin lesions.  Orthopedic: Normal joint ROM without pain or crepitus bilaterally. No visible deformities. Tender to palpation at the calcaneal tuber bilaterally. No pain with calcaneal squeeze bilaterally. Ankle ROM diminished range of motion bilaterally. Silfverskiold Test: negative bilaterally.   Radiographs: Taken and reviewed. No acute fractures or dislocations. No evidence of stress fracture.  Plantar heel spur absent. Posterior heel spur absent.   Assessment:   No diagnosis found.   Plan:  Patient was evaluated and treated and all questions answered.  Plantar Fasciitis, bilaterally - XR reviewed as above.  - Educated on icing and stretching. Instructions given.  -Third injection delivered to the plantar fascia as below. - DME: None - Pharmacologic management: None  Procedure: Injection Tendon/Ligament Location: Bilateral plantar fascia at the glabrous junction; medial approach. Skin Prep: alcohol Injectate: 0.5 cc 0.5% marcaine plain, 0.5 cc of 1% Lidocaine, 0.5 cc kenalog 10. Disposition: Patient tolerated procedure well. Injection site dressed with a band-aid.  No follow-ups on file.

## 2021-05-25 ENCOUNTER — Ambulatory Visit: Payer: Medicaid Other | Admitting: Critical Care Medicine
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
## 2021-06-10 IMAGING — CT CT CERVICAL SPINE W/O CM
3 of 4 series · 13 of 33 positions shown, 16 images · non-contrast
Comparison: None.

CLINICAL DATA: Seizure, altered level of consciousness

EXAM:
CT HEAD WITHOUT CONTRAST
CT CERVICAL SPINE WITHOUT CONTRAST
TECHNIQUE: Multidetector CT imaging of the head and cervical spine was
performed following the standard protocol without intravenous
contrast. Multiplanar CT image reconstructions of the cervical spine
were also generated.

[Series 8: orthogonal axials · axial · 0.21mm/px · z∈[-299,-171]mm · 5 of 95 slices shown, 7 images]
[im 16/95  soft-tissue]
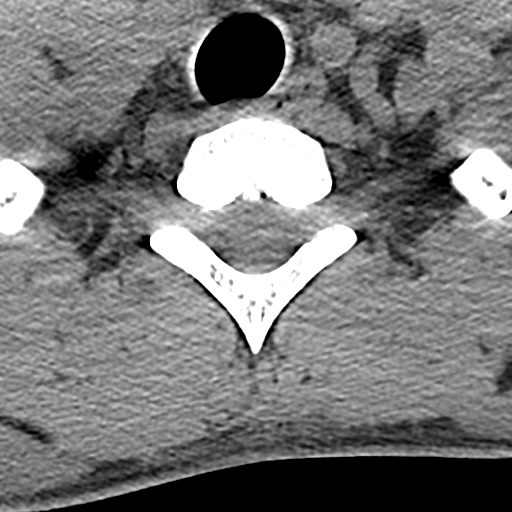
[im 16/95  bone]
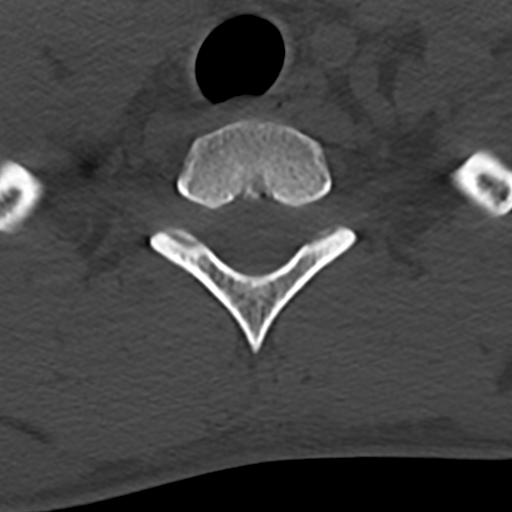
[im 32/95  bone]
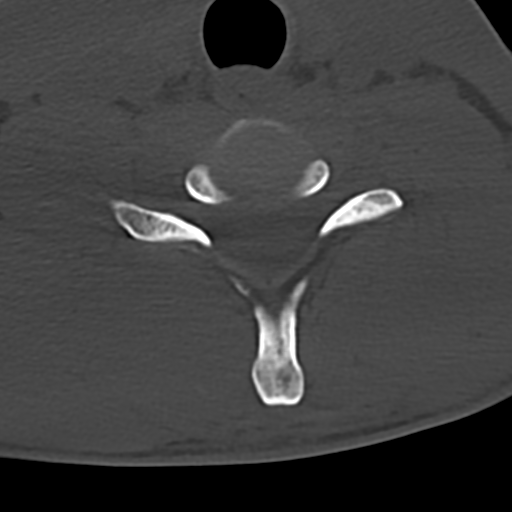
[im 48/95  bone]
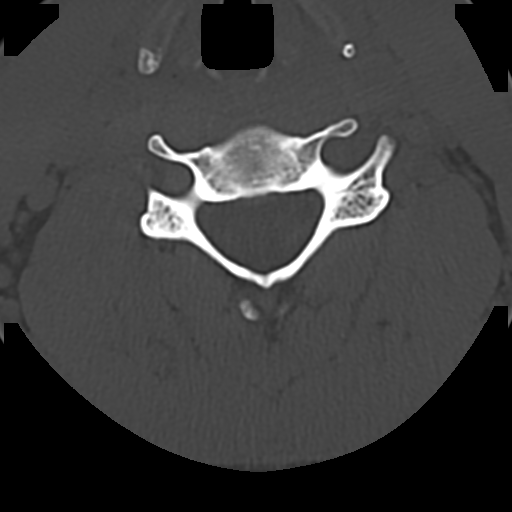
[im 63/95  bone]
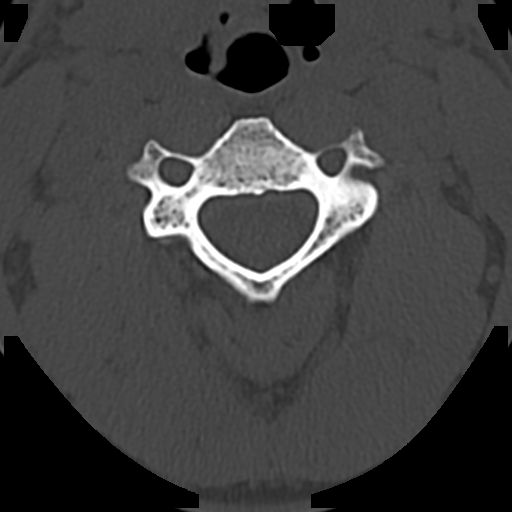
[im 79/95  soft-tissue]
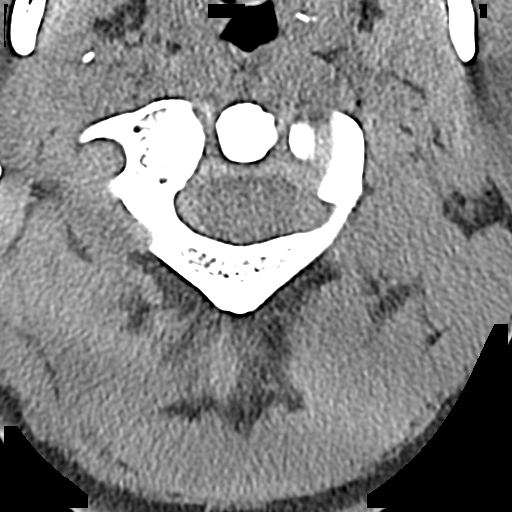
[im 79/95  bone]
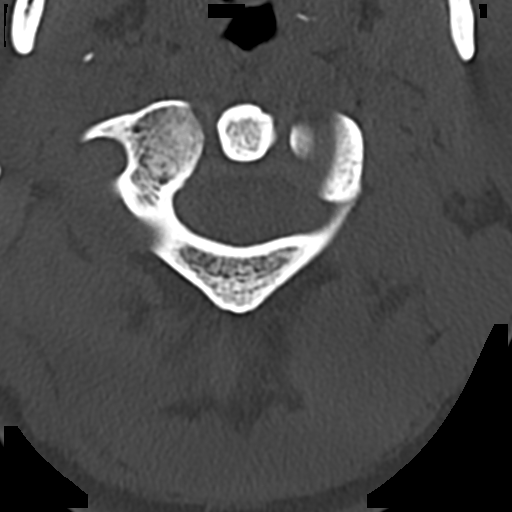

[Series 9: sag bone · sagittal · 0.30mm/px · 5 of 78 slices shown, 6 images]
[im 26/78  bone]
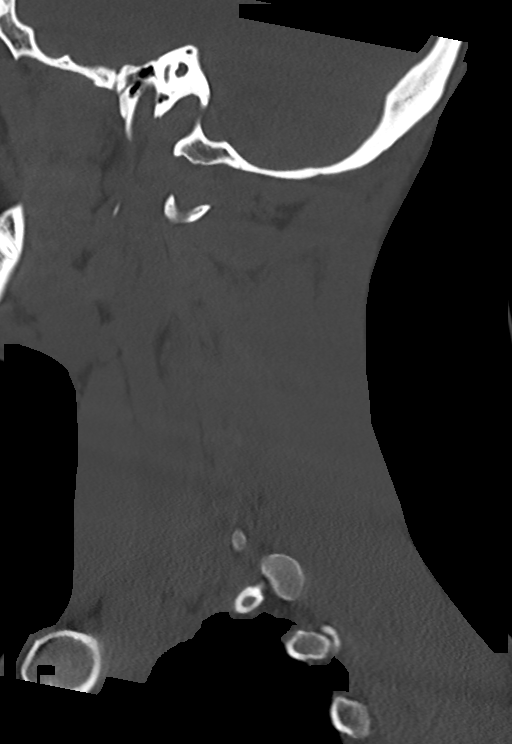
[im 33/78  bone]
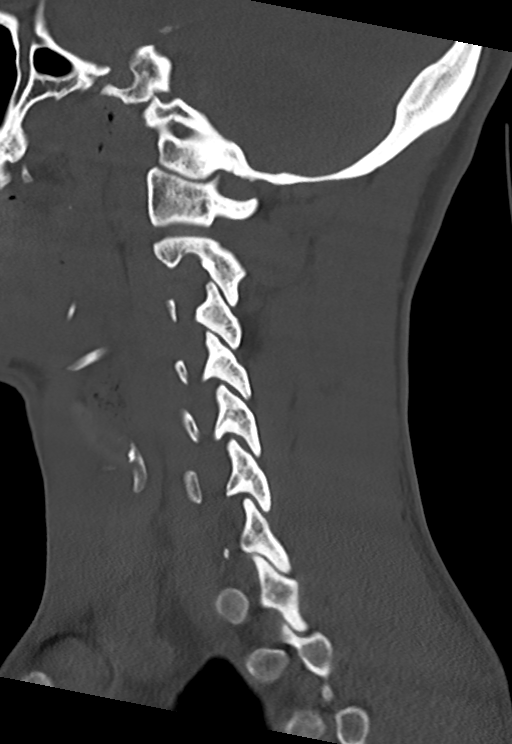
[im 39/78  soft-tissue]
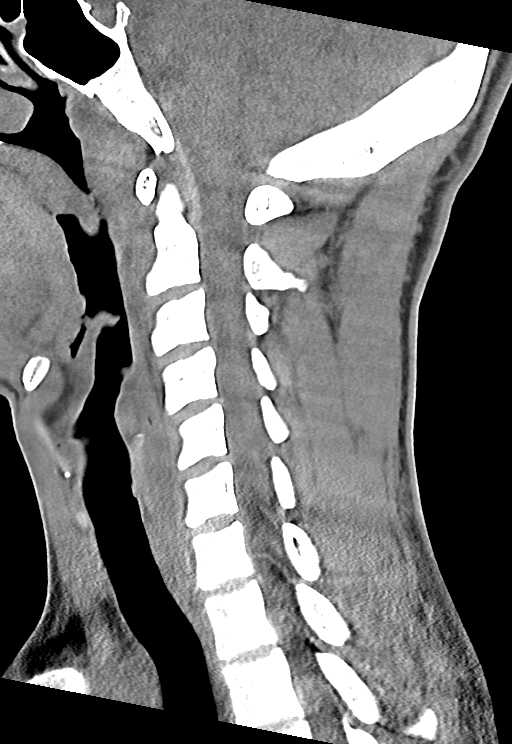
[im 39/78  bone]
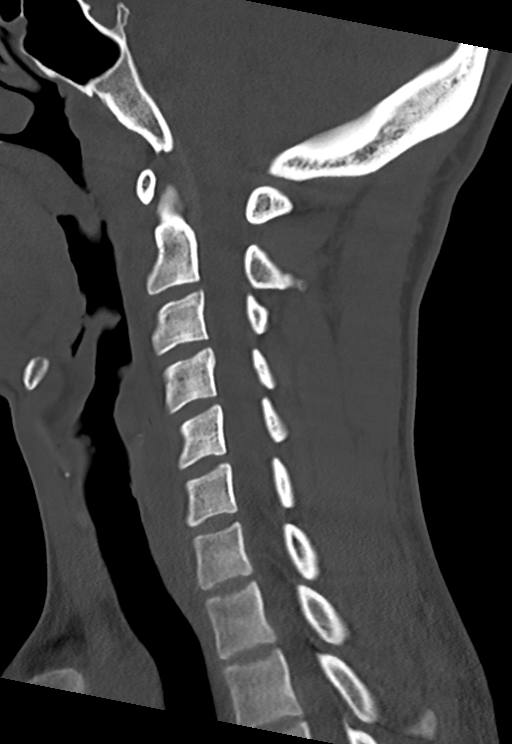
[im 45/78  bone]
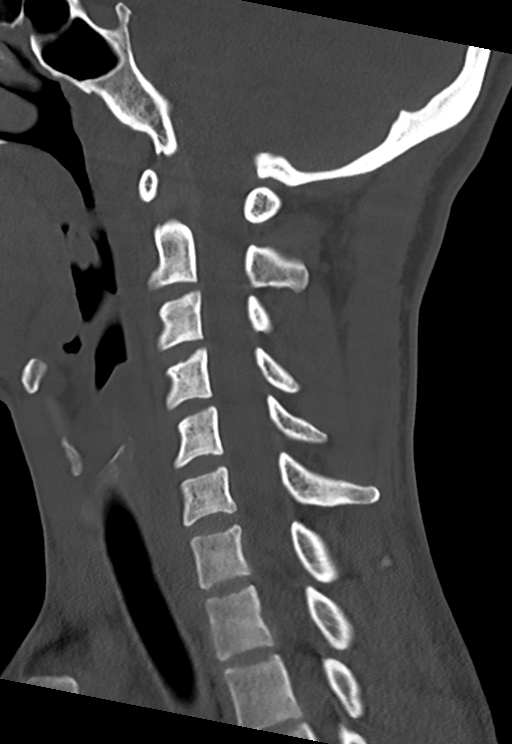
[im 52/78  bone]
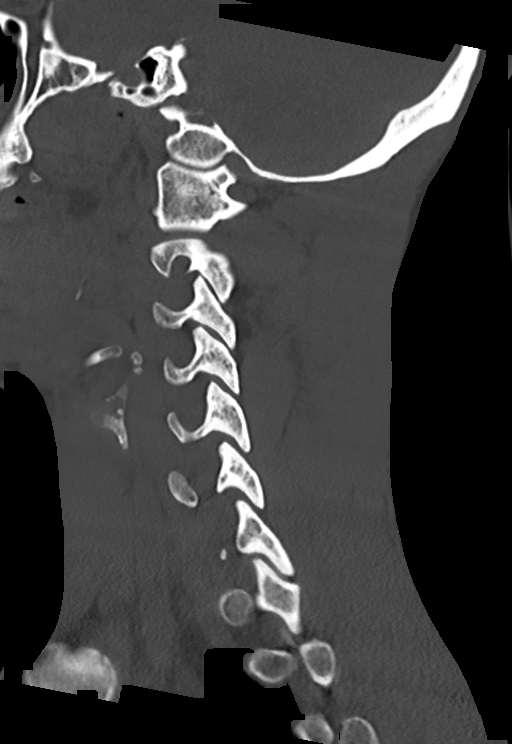

[Series 10: cor bone · coronal · 0.32mm/px · 3 of 68 slices shown]
[im 14/68  bone]
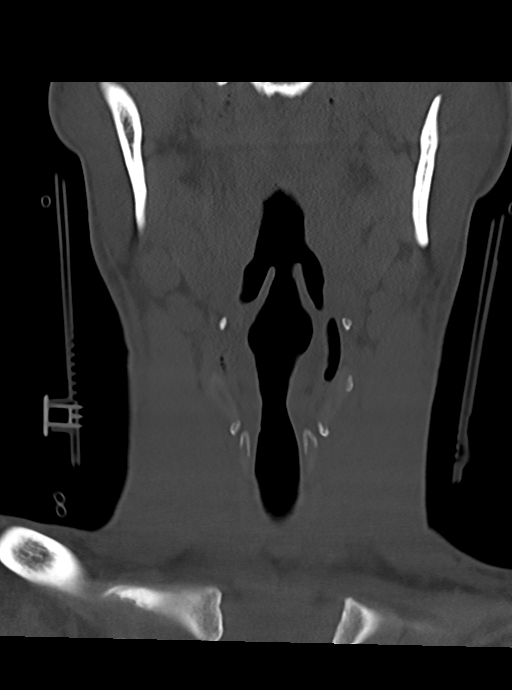
[im 27/68  bone]
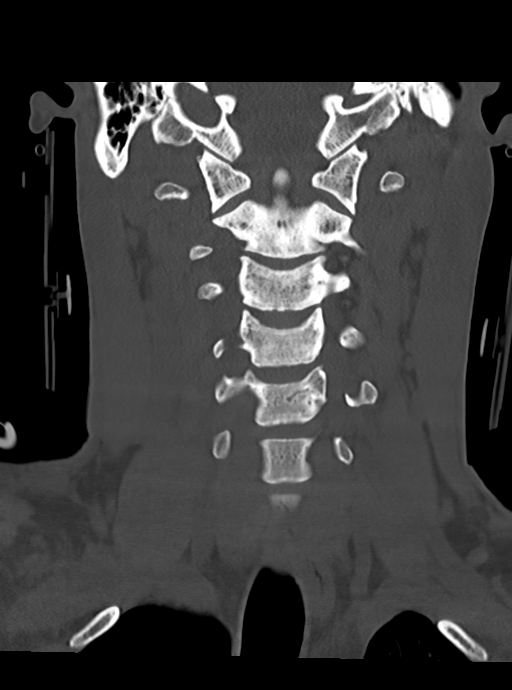
[im 41/68  bone]
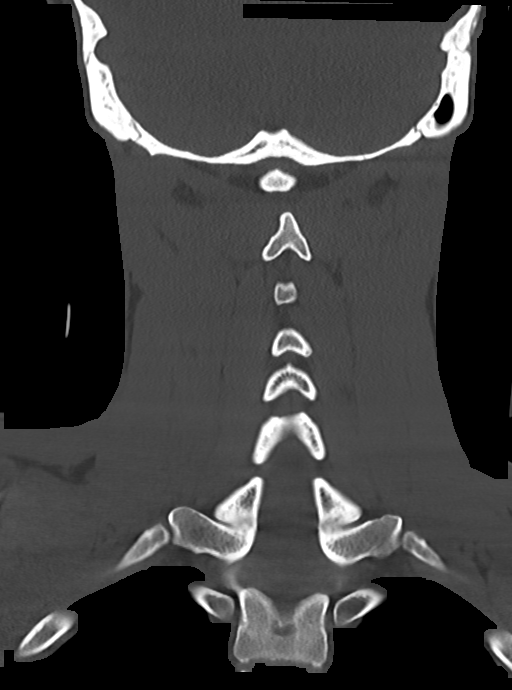

[13 of 33 positions shown; findings below may reference images not displayed]

FINDINGS: CT HEAD FINDINGS

Brain: No acute infarct or hemorrhage. Lateral ventricles and
midline structures are unremarkable. No acute extra-axial fluid
collections. No mass effect.

Vascular: Choose 1

Skull: Normal. Negative for fracture or focal lesion.

Sinuses/Orbits: Polypoid mucosal thickening bilateral maxillary
sinuses, left greater than right. Remaining sinuses are clear.

Other: None.

CT CERVICAL SPINE FINDINGS

Alignment: Alignment is anatomic.

Skull base and vertebrae: No acute displaced fracture.

Soft tissues and spinal canal: No prevertebral fluid or swelling. No
visible canal hematoma.

Disc levels:  No significant spondylosis or facet hypertrophy.

Upper chest: Airway is patent.  Lung apices are clear.

Other: Reconstructed images demonstrate no additional findings.
IMPRESSION: 1. No acute intracranial process.
2. No acute cervical spine fracture.

## 2021-06-10 IMAGING — CT CT HEAD W/O CM
4 series · 16 of 47 positions shown, 18 images · non-contrast
Comparison: None.

CLINICAL DATA: Seizure, altered level of consciousness

EXAM:
CT HEAD WITHOUT CONTRAST
CT CERVICAL SPINE WITHOUT CONTRAST
TECHNIQUE: Multidetector CT imaging of the head and cervical spine was
performed following the standard protocol without intravenous
contrast. Multiplanar CT image reconstructions of the cervical spine
were also generated.

[Series 3: head wo · axial · 0.45mm/px · z∈[-146,-26]mm · 7 of 34 slices shown, 9 images]
[im 5/34  brain]
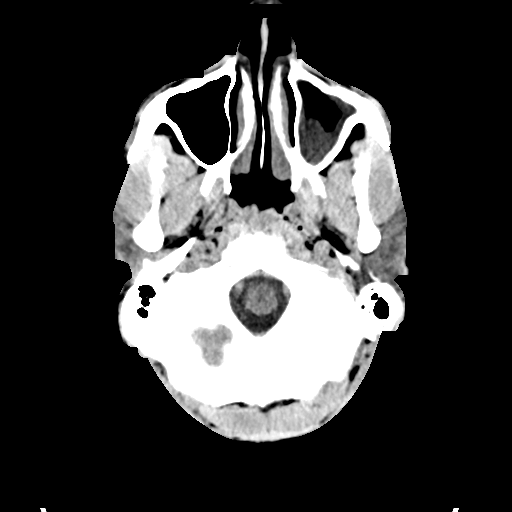
[im 5/34  bone]
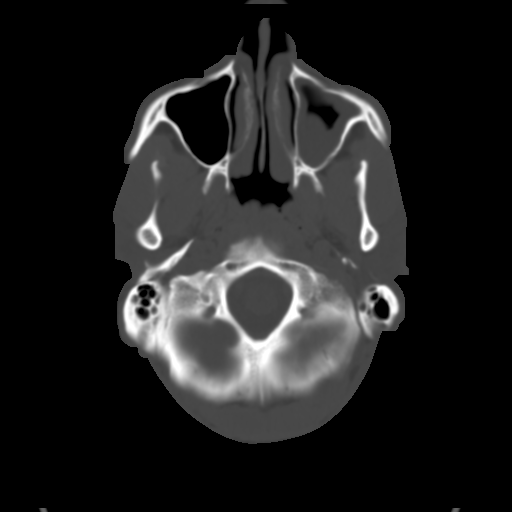
[im 9/34  brain]
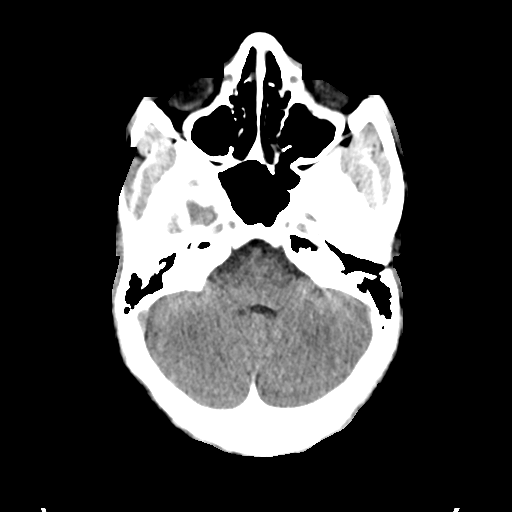
[im 13/34  brain]
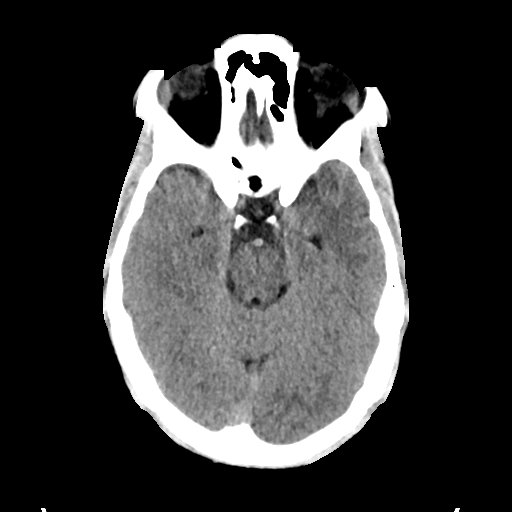
[im 17/34  brain]
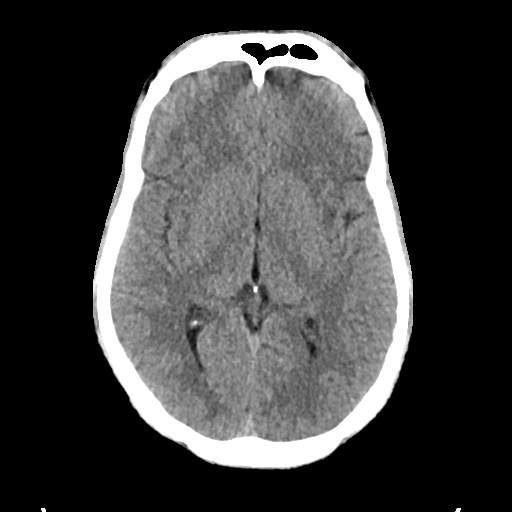
[im 21/34  brain]
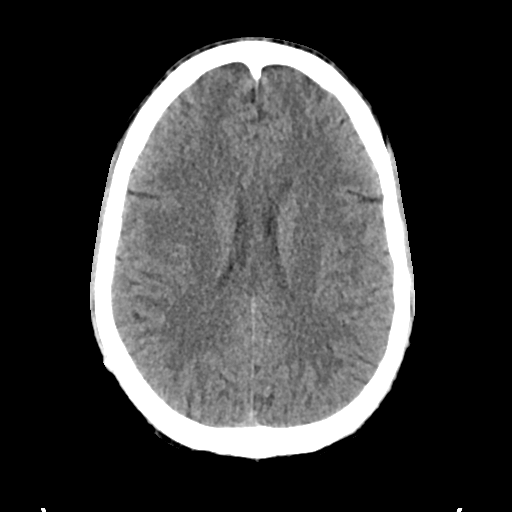
[im 21/34  bone]
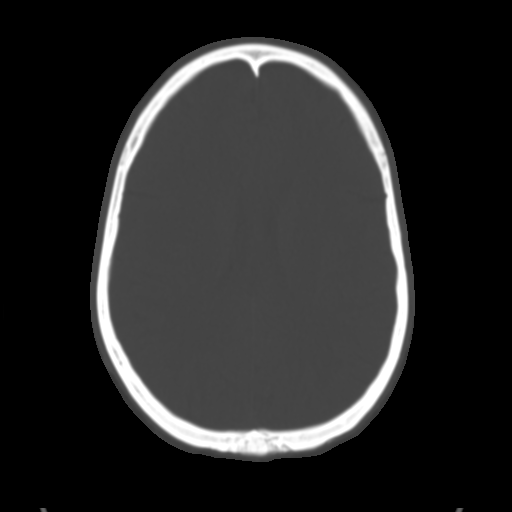
[im 25/34  brain]
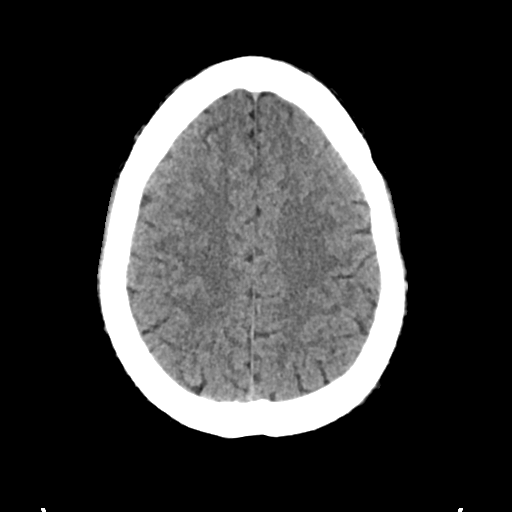
[im 29/34  brain]
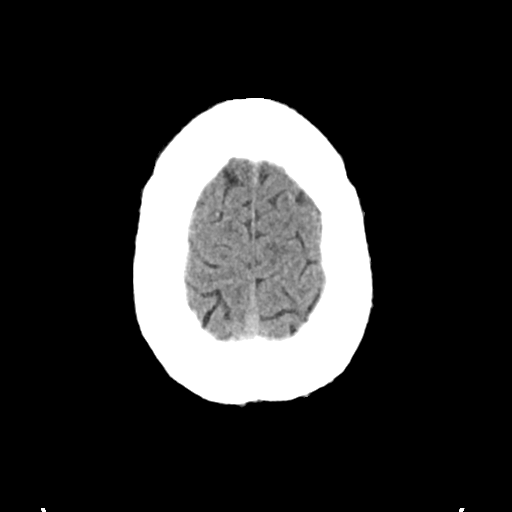

[Series 4: head bone · axial · 0.45mm/px · z∈[-150,-118]mm · 3 of 83 slices shown]
[im 9/83  bone]
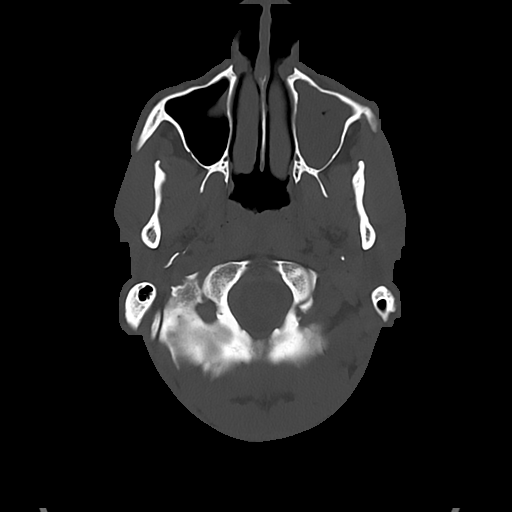
[im 17/83  bone]
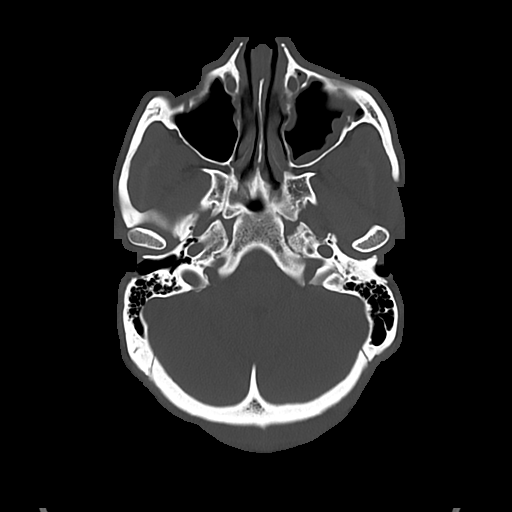
[im 25/83  bone]
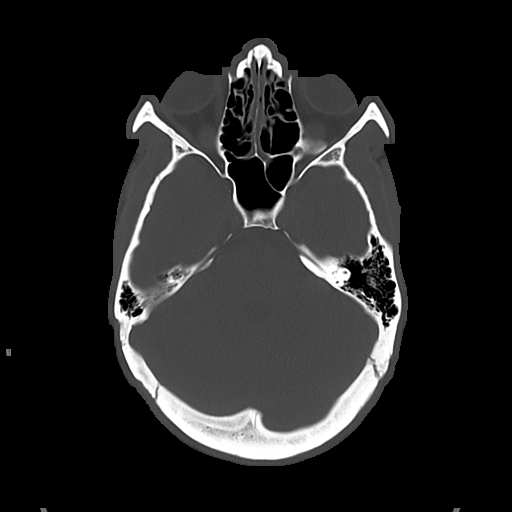

[Series 5: cor soft · coronal · 0.32mm/px · 3 of 73 slices shown]
[im 25/73  brain]
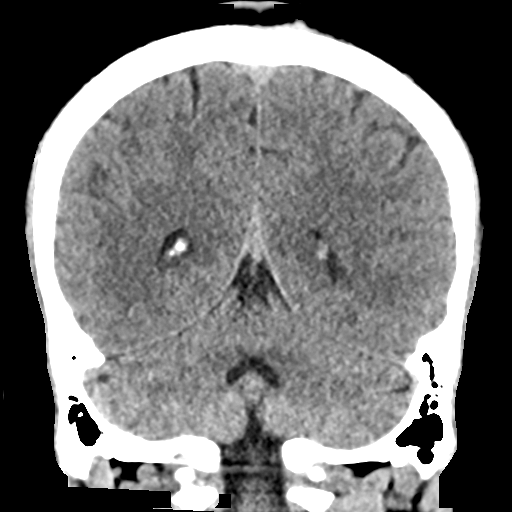
[im 33/73  brain]
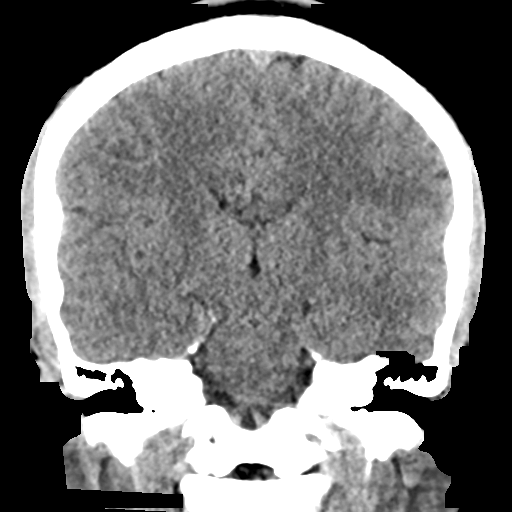
[im 41/73  brain]
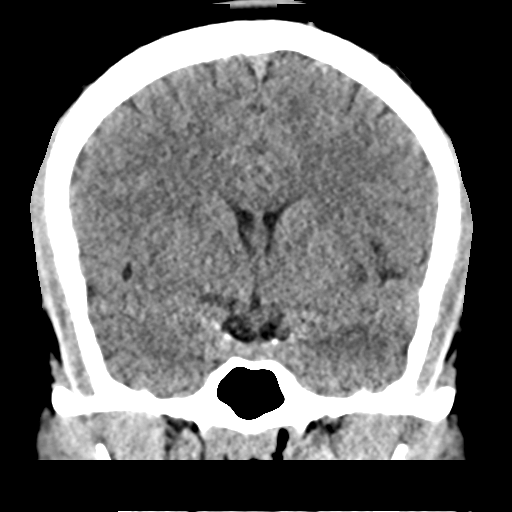

[Series 6: sag soft · sagittal · 0.32mm/px · 3 of 55 slices shown]
[im 19/55  brain]
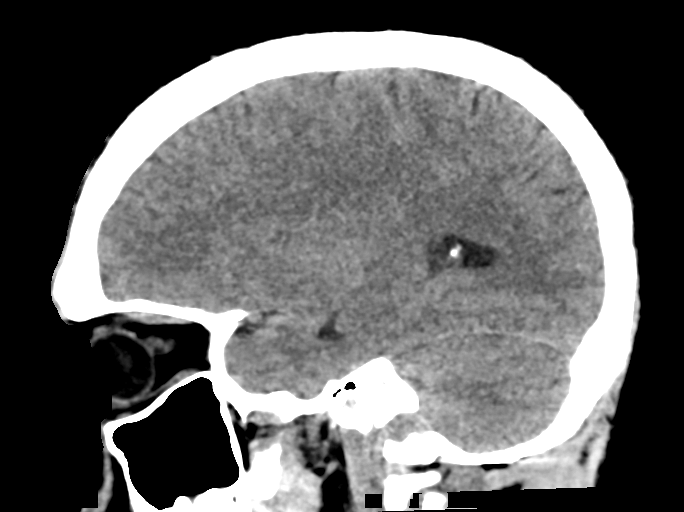
[im 28/55  brain]
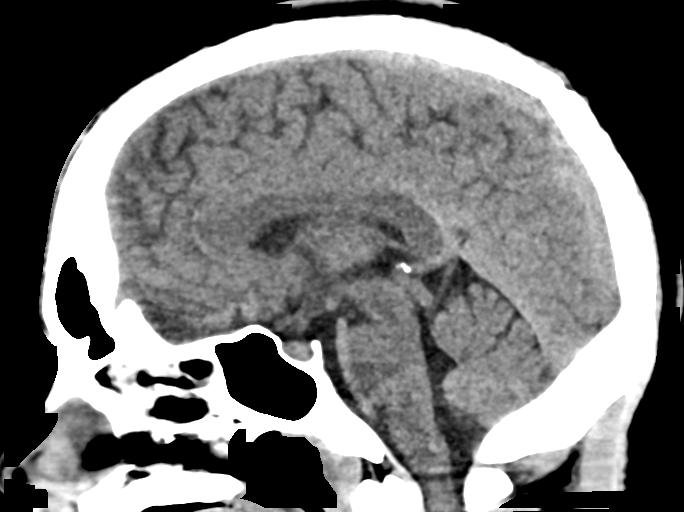
[im 37/55  brain]
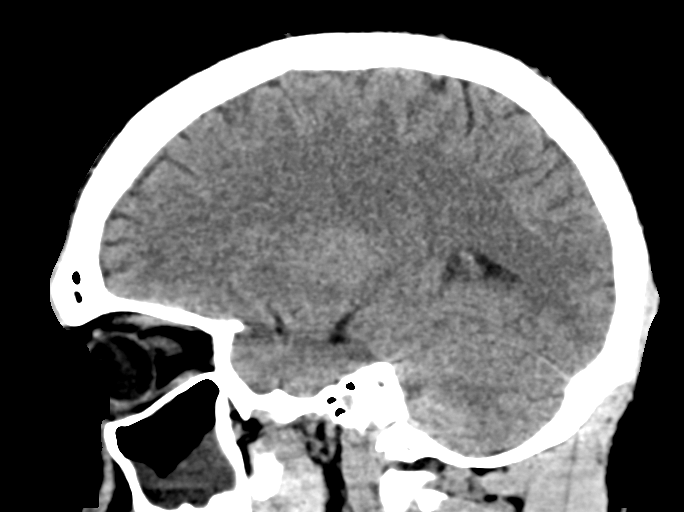

[16 of 47 positions shown; findings below may reference images not displayed]

FINDINGS: CT HEAD FINDINGS

Brain: No acute infarct or hemorrhage. Lateral ventricles and
midline structures are unremarkable. No acute extra-axial fluid
collections. No mass effect.

Vascular: Choose 1

Skull: Normal. Negative for fracture or focal lesion.

Sinuses/Orbits: Polypoid mucosal thickening bilateral maxillary
sinuses, left greater than right. Remaining sinuses are clear.

Other: None.

CT CERVICAL SPINE FINDINGS

Alignment: Alignment is anatomic.

Skull base and vertebrae: No acute displaced fracture.

Soft tissues and spinal canal: No prevertebral fluid or swelling. No
visible canal hematoma.

Disc levels:  No significant spondylosis or facet hypertrophy.

Upper chest: Airway is patent.  Lung apices are clear.

Other: Reconstructed images demonstrate no additional findings.
IMPRESSION: 1. No acute intracranial process.
2. No acute cervical spine fracture.

## 2021-06-29 ENCOUNTER — Encounter: Payer: Self-pay | Admitting: Critical Care Medicine

## 2021-06-29 ENCOUNTER — Telehealth: Payer: Self-pay | Admitting: Critical Care Medicine

## 2021-06-29 ENCOUNTER — Ambulatory Visit: Payer: Medicaid Other | Attending: Critical Care Medicine | Admitting: Critical Care Medicine

## 2021-06-29 ENCOUNTER — Other Ambulatory Visit: Payer: Self-pay

## 2021-06-29 VITALS — BP 117/73 | HR 62 | Resp 16

## 2021-06-29 DIAGNOSIS — K219 Gastro-esophageal reflux disease without esophagitis: Secondary | ICD-10-CM | POA: Insufficient documentation

## 2021-06-29 DIAGNOSIS — R55 Syncope and collapse: Secondary | ICD-10-CM | POA: Diagnosis present

## 2021-06-29 DIAGNOSIS — F39 Unspecified mood [affective] disorder: Secondary | ICD-10-CM | POA: Diagnosis not present

## 2021-06-29 DIAGNOSIS — R4182 Altered mental status, unspecified: Secondary | ICD-10-CM | POA: Insufficient documentation

## 2021-06-29 DIAGNOSIS — F17210 Nicotine dependence, cigarettes, uncomplicated: Secondary | ICD-10-CM | POA: Insufficient documentation

## 2021-06-29 DIAGNOSIS — Z131 Encounter for screening for diabetes mellitus: Secondary | ICD-10-CM | POA: Insufficient documentation

## 2021-06-29 DIAGNOSIS — J301 Allergic rhinitis due to pollen: Secondary | ICD-10-CM | POA: Diagnosis not present

## 2021-06-29 DIAGNOSIS — Z760 Encounter for issue of repeat prescription: Secondary | ICD-10-CM | POA: Diagnosis not present

## 2021-06-29 DIAGNOSIS — Z79899 Other long term (current) drug therapy: Secondary | ICD-10-CM | POA: Diagnosis not present

## 2021-06-29 DIAGNOSIS — J309 Allergic rhinitis, unspecified: Secondary | ICD-10-CM | POA: Insufficient documentation

## 2021-06-29 DIAGNOSIS — F1020 Alcohol dependence, uncomplicated: Secondary | ICD-10-CM | POA: Insufficient documentation

## 2021-06-29 LAB — POCT GLYCOSYLATED HEMOGLOBIN (HGB A1C): HbA1c, POC (controlled diabetic range): 5.5 % (ref 0.0–7.0)

## 2021-06-29 MED ORDER — FLUTICASONE PROPIONATE 50 MCG/ACT NA SUSP
1.0000 | Freq: Every day | NASAL | 11 refills | Status: DC
  Filled 2021-12-20: qty 16, 30d supply, fill #0

## 2021-06-29 MED ORDER — PANTOPRAZOLE SODIUM 40 MG PO TBEC: 40.0000 mg | DELAYED_RELEASE_TABLET | Freq: Every day | ORAL | 3 refills | Status: DC

## 2021-06-29 MED ORDER — ARIPIPRAZOLE 10 MG PO TABS
10.0000 mg | ORAL_TABLET | Freq: Every day | ORAL | 2 refills | Status: DC
  Filled 2021-06-29: qty 30, 30d supply, fill #0

## 2021-06-29 MED ORDER — CETIRIZINE HCL 10 MG PO TABS
10.0000 mg | ORAL_TABLET | Freq: Every day | ORAL | 11 refills | Status: DC
  Filled 2021-12-20: qty 30, 30d supply, fill #0

## 2021-06-29 NOTE — Assessment & Plan Note (Signed)
Patient is yet to achieve mental health clinic visit we will refill Abilify and have given him another opportunity to get to the behavioral health clinic gave him information on how to achieve

## 2021-06-29 NOTE — Progress Notes (Signed)
Established Patient Office Visit  Subjective:  Patient ID: William Mayer, male    DOB: 06-30-1991  Age: 30 y.o. MRN: [id number]  CC:  Chief Complaint  Patient presents with  . Loss of Consciousness     HPI William Mayer presents for post hospital follow-up alcohol use and schizoaffective disorder.  Since the last visit in March the patient was in the emergency room in June for alcohol intoxication.  Below is documentation of that visit.  William Mayer is a 30 y.o. male w PMHx severe alcohol dependence, presenting to the ED via EMS from home for AMS. It is reported in triage documentation that patient mother thought he "was not acting right" after returning home from work, but patient apparently admitted to drinking liquor and beer today.    LEVEL 5 CAVEAT 2/t AMS. Patient is arousable but not answering many questions. Endorses alcohol use but will not tell me what kind or how much. Denies drug use.    The history is provided by the patient. The history is limited by the condition of the patient.   Patient with hx of alcohol dependence, brought in by mother for AMS. He appears intoxicated however due to limited history and acute alteration in mental status, will obtain some blood work for [employer], screening labs. IVF and zofran ordered.    [name] levels not remarkably high.  Due to patient's mental status, CT head is obtained and is negative.  On reevaluation he is much more conversant and alert.  With encouragement he is more talkative.  He is oriented.  He endorses alcohol use today, denies drug use.  Denies injuries today..  Requests something to eat.  He is ambulating with steady gait without assistance.  His mother arrives to take him home, believe he is appropriate for discharge to home in her care.  The patient has been taking his Abilify but does need refills.  He is yet to achieve a visit at the [facility].  We made the referral back in March  he is yet to achieve this.  He has not had recurrent seizures.  He is still taking alcohol and will need further follow-up with licensed clinical social work.  Patient does have allergic rhinitis and is needing refills on cetirizine and Flonase.  His stomach acid situation is improved on reflux medications.  His low back pain has resolved. Past Medical History:  Diagnosis Date  . Allergic rhinitis   . Seizure (HCC) 01/23/2021    Past Surgical History:  Procedure Laterality Date  . NO PAST SURGERIES      Family History  Problem Relation Age of Onset  . Gout Father   . Arthritis Father     Social History   Socioeconomic History  . Marital status: Single    Spouse name: Not on file  . Number of children: Not on file  . Years of education: Not on file  . Highest education level: Not on file  Occupational History  . Not on file  Tobacco Use  . Smoking status: Light Smoker    Packs/day: 0.25    Types: Cigarettes  . Smokeless tobacco: Never  . Tobacco comments:    1 or 2  Vaping Use  . Vaping Use: Never used  Substance and Sexual Activity  . Alcohol use: Yes    Comment: Daily. Last drink: last night   . Drug use: Yes    Types: Marijuana    Comment:  Last used: yesterday   . Sexual activity: Yes  Other Topics Concern  . Not on file  Social History Narrative  . Not on file   Social Determinants of Health   Financial Resource Strain: Not on file  Food Insecurity: Not on file  Transportation Needs: Not on file  Physical Activity: Not on file  Stress: Not on file  Social Connections: Not on file  Intimate Partner Violence: Not on file    Outpatient Medications Prior to Visit  Medication Sig Dispense Refill  . ibuprofen (ADVIL) 600 MG tablet Take 1 tablet (600 mg total) by mouth every 6 (six) hours as needed for moderate pain. 60 tablet 1  . ARIPiprazole (ABILIFY) 10 MG tablet Take 1 tablet (10 mg total) by mouth daily. 40 tablet 2  . pantoprazole (PROTONIX) 40 MG  tablet Take 1 tablet (40 mg total) by mouth daily. 30 tablet 3  . cetirizine (ZYRTEC) 10 MG tablet Take 1 tablet (10 mg total) by mouth daily. (Patient not taking: Reported on 06/29/2021) 30 tablet 11  . fluticasone (FLONASE) 50 MCG/ACT nasal spray Place 1 spray into both nostrils daily. (Patient not taking: Reported on 06/29/2021) 16 g 11   No facility-administered medications prior to visit.    Allergies  Allergen Reactions  . Amoxicillin Hives and Itching    ROS Review of Systems  Constitutional:  Negative for chills, diaphoresis and fever.  HENT:  Positive for congestion, postnasal drip and rhinorrhea. Negative for ear discharge, ear pain, hearing loss, nosebleeds, sore throat and tinnitus.   Eyes:  Negative for photophobia and discharge.  Respiratory:  Negative for cough, shortness of breath, wheezing and stridor.        No excess mucus  Cardiovascular:  Negative for chest pain, palpitations and leg swelling.  Gastrointestinal:  Negative for abdominal pain, blood in stool, constipation, diarrhea, nausea and vomiting.  Endocrine: Negative for polydipsia.  Genitourinary:  Negative for dysuria, flank pain, frequency, hematuria and urgency.  Musculoskeletal:  Negative for back pain, myalgias and neck pain.  Skin:  Negative for rash.  Allergic/Immunologic: Negative for environmental allergies.  Neurological:  Negative for dizziness, tremors, seizures, weakness and headaches.  Hematological:  Does not bruise/bleed easily.  Psychiatric/Behavioral:  Positive for behavioral problems and decreased concentration. Negative for hallucinations and suicidal ideas. The patient is nervous/anxious.   All other systems reviewed and are negative.    Objective:    Physical Exam Vitals reviewed.  Constitutional:      Appearance: Normal appearance. He is well-developed. He is not diaphoretic.  HENT:     Head: Normocephalic and atraumatic.     Nose: No nasal deformity, septal deviation, mucosal  edema or rhinorrhea.     Right Sinus: No maxillary sinus tenderness or frontal sinus tenderness.     Left Sinus: No maxillary sinus tenderness or frontal sinus tenderness.     Mouth/Throat:     Pharynx: No oropharyngeal exudate.  Eyes:     General: No scleral icterus.    Conjunctiva/sclera: Conjunctivae normal.     Pupils: Pupils are equal, round, and reactive to light.  Neck:     Thyroid: No thyromegaly.     Vascular: No carotid bruit or JVD.     Trachea: Trachea normal. No tracheal tenderness or tracheal deviation.  Cardiovascular:     Rate and Rhythm: Normal rate and regular rhythm.     Chest Wall: PMI is not displaced.     Pulses: Normal pulses. No decreased pulses.  Heart sounds: Normal heart sounds, S1 normal and S2 normal. Heart sounds not distant. No murmur heard. No systolic murmur is present.  No diastolic murmur is present.    No friction rub. No gallop. No S3 or S4 sounds.  Pulmonary:     Effort: No tachypnea, accessory muscle usage or respiratory distress.     Breath sounds: No stridor. No decreased breath sounds, wheezing, rhonchi or rales.  Chest:     Chest wall: No tenderness.  Abdominal:     General: Bowel sounds are normal. There is no distension.     Palpations: Abdomen is soft. Abdomen is not rigid.     Tenderness: There is no abdominal tenderness. There is no guarding or rebound.  Musculoskeletal:        General: Normal range of motion.     Cervical back: Normal range of motion and neck supple. No edema, erythema or rigidity. No muscular tenderness. Normal range of motion.  Lymphadenopathy:     Head:     Right side of head: No submental or submandibular adenopathy.     Left side of head: No submental or submandibular adenopathy.     Cervical: No cervical adenopathy.  Skin:    General: Skin is warm and dry.     Coloration: Skin is not pale.     Findings: No rash.     Nails: There is no clubbing.  Neurological:     Mental Status: He is alert and  oriented to person, place, and time.     Sensory: No sensory deficit.  Psychiatric:        Speech: Speech normal.     Comments: Appears to have a reserved depressed affect and is not very interactive at this visit    BP 117/73   Pulse 62   Resp 16   SpO2 100%  Wt Readings from Last 3 Encounters:  01/23/21 124 lb (56.2 kg)  05/12/20 134 lb (60.8 kg)  09/19/19 130 lb (59 kg)     There are no preventive care reminders to display for this patient.   There are no preventive care reminders to display for this patient.  Lab Results  Component Value Date   TSH 2.040 09/05/2018   Lab Results  Component Value Date   WBC 4.8 04/27/2021   HGB 13.7 04/27/2021   HCT 41.5 04/27/2021   MCV 100.5 (H) 04/27/2021   PLT 199 04/27/2021   Lab Results  Component Value Date   NA 139 04/27/2021   K 3.8 04/27/2021   CO2 21 (L) 04/27/2021   GLUCOSE 96 04/27/2021   BUN 12 04/27/2021   CREATININE 1.07 04/27/2021   BILITOT 0.7 04/27/2021   ALKPHOS 45 04/27/2021   AST 16 04/27/2021   ALT 13 04/27/2021   PROT 6.5 04/27/2021   ALBUMIN 3.7 04/27/2021   CALCIUM 8.6 (L) 04/27/2021   ANIONGAP 10 04/27/2021   No results found for: CHOL No results found for: HDL No results found for: LDLCALC No results found for: TRIG No results found for: CHOLHDL Lab Results  Component Value Date   HGBA1C 5.5 06/29/2021      Assessment & Plan:   Problem List Items Addressed This Visit       Respiratory   Allergic rhinitis    Chronic allergic rhinitis we will refill Flonase and cetirizine      Relevant Medications   cetirizine (ZYRTEC) 10 MG tablet   fluticasone (FLONASE) 50 MCG/ACT nasal spray     Digestive  GERD (gastroesophageal reflux disease)    Chronic reflux disease I did refill pantoprazole      Relevant Medications   pantoprazole (PROTONIX) 40 MG tablet     Other   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    Patient is yet to achieve mental health clinic visit we will  refill Abilify and have given him another opportunity to get to the behavioral health clinic gave him information on how to achieve      Alcohol use disorder, severe, dependence (HCC)    Significant alcohol use disorder still ongoing recent admission to the ER in June for same  Referral to licensed clinical social worker made      Diabetes mellitus screening    Due to mental health condition hemoglobin A1c was asked to be checked it is normal he does not have diabetes      Relevant Orders   POCT glycosylated hemoglobin (Hb A1C) (Completed)    Meds ordered this encounter  Medications  . ARIPiprazole (ABILIFY) 10 MG tablet    Sig: Take 1 tablet (10 mg total) by mouth daily.    Dispense:  40 tablet    Refill:  2  . cetirizine (ZYRTEC) 10 MG tablet    Sig: Take 1 tablet (10 mg total) by mouth daily.    Dispense:  30 tablet    Refill:  11  . fluticasone (FLONASE) 50 MCG/ACT nasal spray    Sig: Place 1 spray into both nostrils daily.    Dispense:  16 g    Refill:  11  . pantoprazole (PROTONIX) 40 MG tablet    Sig: Take 1 tablet (40 mg total) by mouth daily.    Dispense:  30 tablet    Refill:  3     Follow-up: Return in about 4 months (around 10/29/2021).    Shan Levans, MD

## 2021-06-29 NOTE — Telephone Encounter (Signed)
This patient has severe alcohol use and psycho affective disorder.  I resumed his Abilify and have been trying since March to get him to go to [facility].  I was unsuccessful in trying to refer him.  I gave him the open access information today.  Can you follow-up and call him and get encouraged him to go.  Actually tried calling to make an actual appointment was not successful in this.  The patient knows you will be reaching out to him

## 2021-06-29 NOTE — Assessment & Plan Note (Signed)
Chronic allergic rhinitis we will refill Flonase and cetirizine

## 2021-06-29 NOTE — Assessment & Plan Note (Signed)
Due to mental health condition hemoglobin A1c was asked to be checked it is normal he does not have diabetes

## 2021-06-29 NOTE — Patient Instructions (Signed)
Refills on all your medications sent to our pharmacy please pick them up on the way out of the clinic today  Please go to the [facility] at [address] they have open access day Monday through Thursday between 8 and 11 AM.  Please arrive between 730 and 7:50 AM.  Go to the second floor.  I will send a referral ahead of time so they are aware of your situation.  They need to see you in order to be able to do medication management and further assessment of your mental health condition  Continue to work on reducing alcohol intake and I will have my licensed clinical social worker Asante call you as well for behavioral counseling  Return to see Dr. Delford Field in 4 months

## 2021-06-29 NOTE — Assessment & Plan Note (Signed)
Significant alcohol use disorder still ongoing recent admission to the ER in June for same  Referral to licensed clinical social worker made

## 2021-06-29 NOTE — Assessment & Plan Note (Signed)
Chronic reflux disease I did refill pantoprazole

## 2021-06-30 ENCOUNTER — Other Ambulatory Visit: Payer: Self-pay

## 2021-06-30 NOTE — Telephone Encounter (Signed)
First attempt to reach pt, no answer, left vm.

## 2021-07-05 ENCOUNTER — Other Ambulatory Visit: Payer: Self-pay

## 2021-07-07 NOTE — Telephone Encounter (Signed)
Second attempt to reach pt, no answer, left vm.

## 2021-07-11 NOTE — Telephone Encounter (Signed)
Spoke with pt's mother and scheduled appt for 08/01/21 at 2:00

## 2021-08-01 ENCOUNTER — Institutional Professional Consult (permissible substitution): Payer: Medicaid Other | Admitting: Clinical

## 2021-08-10 ENCOUNTER — Telehealth: Payer: Self-pay | Admitting: Critical Care Medicine

## 2021-08-10 NOTE — Telephone Encounter (Signed)
Copied from CRM [id number]. Topic: General - Other >> Aug 01, 2021 12:45 PM William Mayer wrote: Reason for CRM: Patient mother called in andcancelled his visit for today he had to work and wont make it in asking for Mayer call back to reschedule please at Ph# [phone]   Missed appt with you on 10/4

## 2021-09-15 NOTE — Telephone Encounter (Signed)
Spoke with pt's mother and scheduled appt for 10/02/21 at 10:00am.

## 2021-10-02 ENCOUNTER — Other Ambulatory Visit: Payer: Self-pay

## 2021-10-02 ENCOUNTER — Ambulatory Visit: Payer: Medicaid Other | Attending: Critical Care Medicine | Admitting: Clinical

## 2021-10-02 DIAGNOSIS — F4323 Adjustment disorder with mixed anxiety and depressed mood: Secondary | ICD-10-CM

## 2021-10-02 NOTE — BH Specialist Note (Signed)
Integrated Behavioral Health Initial In-Person Visit  MRN: [id number] Name: William Mayer  Number of Integrated Behavioral Health Clinician visits:: 1/6 Session Start time: 10:00am  Session End time: 10:45am Total time: 45  minutes  Types of Service: Individual psychotherapy  Interpretor:No. Interpretor Name and Language: N/A   Warm Hand Off Completed.        Subjective: William Mayer is a 30 y.o. male accompanied by  self Patient was referred by Shan Levans, MD for alcohol use and schizoaffective disorder. Patient reports the following symptoms/concerns: Reports that he has been stressed about finances and wants his disability to be increased. Reports making $800-$900 in disability. Denies SI/HI. Denies any auditory/visual hallucinations. Reports drinking alcohol 2-3 days/week. Reports tobacco use with cigarettes 1-2 times/day. Reports that he is interested in tobacco cessation. Reports that his mother wanted him to go to therapy and psychiatry but he does not want it and does not believe he needs it. Reports receiving therapy during childhood. Reports a hx of incarceration for 6 years and being released in 2015. Pt did not want to discuss incarceration.  Duration of problem: ongoing; Severity of problem: moderate  Objective: Mood: Anxious and Depressed and Affect:  Flat Risk of harm to self or others: No plan to harm self or others  Life Context: Family and Social: Pt receives support from mother and currently lives with her. School/Work: Pt currently receives disability.  Self-Care: Pt endorses tobacco use daily. Pt reports utilizing alcohol 2-3 times/week. Denies any additional substance use. Pt's chart suggest hx of substance use. Life Changes: Pt is wanting an increase in disability income due to cost of living increase. Pt has been hospitalized in the past however denies this happening.   Patient and/or Family's Strengths/Protective Factors: Concrete supports in place  (healthy food, safe environments, etc.)  Goals Addressed: Patient will: Reduce symptoms of: stress Increase knowledge and/or ability of: coping skills and stress reduction  Demonstrate ability to: Increase healthy adjustment to current life circumstances  Progress towards Goals: Ongoing  Interventions: Interventions utilized: Motivational Interviewing, Supportive Counseling, and Psychoeducation and/or Health Education  Standardized Assessments completed: AUDIT, GAD-7, and PHQ 9 GAD 7 : Generalized Anxiety Score 10/02/2021 06/29/2021 01/23/2021 05/12/2020  Nervous, Anxious, on Edge 0 0 1 0  Control/stop worrying 0 0 0 0  Worry too much - different things 0 0 0 0  Trouble relaxing 0 1 2 2   Restless 0 0 0 2  Easily annoyed or irritable 0 0 0 0  Afraid - awful might happen 0 0 0 0  Total GAD 7 Score 0 1 3 4      Depression screen [facility] 2/9 10/02/2021 06/29/2021 01/23/2021 05/12/2020 09/09/2018  Decreased Interest 0 0 2 0 0  Down, Depressed, Hopeless 0 0 0 0 0  PHQ - 2 Score 0 0 2 0 0  Altered sleeping 0 - 0 0 1  Tired, decreased energy 0 - 1 0 0  Change in appetite 0 - 2 0 0  Feeling bad or failure about yourself  0 - 0 0 0  Trouble concentrating 0 - 0 3 0  Moving slowly or fidgety/restless 0 - 1 0 0  Suicidal thoughts 0 - 0 0 0  PHQ-9 Score 0 - 6 3 1     Flowsheet Row Integrated Behavioral Health from 10/02/2021 in [facility]  Alcohol Use Disorder Identification Test Final Score (AUDIT) 4       Patient and/or Family Response: Pt had difficulty  being receptive to tx due to not understanding why his mother thinks he needs therapy. Pt was engaged in rolling with his resistance to therapy. Pt is receptive to psychoeducation provided on tobacco use as he is receptive to tobacco cessation. Pt is receptive to seeing a psychiatrist as he acknowledged seeing someone in the past but does not know the reason.  Patient Centered Plan: Patient is on the following  Treatment Plan(s):  stress  Assessment: Denies SI/HI. Denies auditory/visual hallucinations. Pt also denies any hospitalizations in the past though pt's chart hx suggest past hospitalizations. Denies substance use. Patient currently experiencing stress with finances. Pt's mood appears to be low and he was fidgeting during session. Pt is experiencing resistance to therapy as he had difficulty responding to questions. Pt was guarded. Pt did not want to discuss his hx with incarceration. Pt's chart suggest hx of severe alcohol use though pt only acknowledges occasional alcohol use and did not discuss further.    Patient may benefit from therapy and psychiatry. Pt is agreeable to fu with [facility] but is not agreeable to being referred for outpatient therapy. Pt is agreeable to seeing psychiatrist. Theresia Majors will refer pt to psychiatrist. [facility] utilized motivation interviewing in order to assist with pt's resistance to therapy and change. [facility] provided psychoeducation on tobacco use as he acknowledged wanting to stop. [facility] will fu with pt to further assist with pt's resistance to change. [facility] encouraged pt to contact DSS about desire for an increase in disability income.  Plan: Follow up with behavioral health clinician on : 11/08/21 Behavioral recommendations: FU with [facility] and utilize provided crisis resources if SI/HI arises with plan, means, and intent. Referral(s): Integrated [employer] (In Clinic) and Psychiatrist "From scale of 1-10, how likely are you to follow plan?": 10  Morrissa Shein C Idris Edmundson, LCSW

## 2021-11-08 ENCOUNTER — Other Ambulatory Visit: Payer: Self-pay

## 2021-11-08 ENCOUNTER — Ambulatory Visit: Payer: Medicaid Other | Attending: Critical Care Medicine | Admitting: Clinical

## 2021-11-08 ENCOUNTER — Ambulatory Visit (INDEPENDENT_AMBULATORY_CARE_PROVIDER_SITE_OTHER): Payer: Medicaid Other | Admitting: Podiatry

## 2021-11-08 DIAGNOSIS — F4323 Adjustment disorder with mixed anxiety and depressed mood: Secondary | ICD-10-CM | POA: Diagnosis not present

## 2021-11-08 DIAGNOSIS — S90111A Contusion of right great toe without damage to nail, initial encounter: Secondary | ICD-10-CM

## 2021-11-10 NOTE — BH Specialist Note (Signed)
Integrated Behavioral Health Follow Up In-Person Visit  MRN: [id number] Name: William Mayer  Number of Integrated Behavioral Health Clinician visits: 2/6 Session Start time: 10:00am  Session End time: 10:30am Total time: 30 minutes  Types of Service: Individual psychotherapy  Interpretor:No. Interpretor Name and Language: N/A  Subjective: William Mayer is a 31 y.o. male accompanied by  self Patient was referred by William Levans, MD for alcohol use and schizoaffective disorder. Patient reports the following symptoms/concerns: Reports that he does not deal with any mental health concerns. Reports involvement in psychiatry in the past. Reports that he experiences back pain.  Duration of problem: ongoing; Severity of problem: moderate  Objective: Mood: Anxious and Depressed and Affect:  Flat Risk of harm to self or others: No plan to harm self or others  Life Context: Family and Social: Pt receives support from mother and currently lives with her. School/Work: Pt currently receives disability.  Self-Care: Pt endorses tobacco use daily. Pt reports utilizing alcohol 2-3 times/week. Denies any additional substance use. Pt's chart suggest hx of substance use. Life Changes: Pt is wanting an increase in disability income due to cost of living increase. Pt has been hospitalized in the past however denies this happening.   Patient and/or Family's Strengths/Protective Factors: Concrete supports in place (healthy food, safe environments, etc.)  Goals Addressed: Patient will:  Reduce symptoms of: stress   Increase knowledge and/or ability of: coping skills and stress reduction   Demonstrate ability to: Increase healthy adjustment to current life circumstances  Progress towards Goals: Ongoing  Interventions: Interventions utilized:  Motivational Interviewing, Supportive Counseling, and Psychoeducation and/or Health Education Standardized Assessments completed: Not Needed  Patient  and/or Family Response: Pt had difficulty being receptive to tx. Pt was engaged in rolling with resistance to therapy. Pt is still receptive to seeing a psychiatrist.  Patient Centered Plan: Patient is on the following Treatment Plan(s): Stress  Assessment: Denies SI/HI. Denies auditory/visual hallucinations. Patient currently experiencing difficulty with being receptive to therapy. Pt appears to have a low mood. Pt was continuously guarded during session.   Patient may benefit from therapy and psychiatry. LCSWA utilized motivational interviewing to engage pt in rolling with resistance. Pt is still being referred for psychiatry.  Plan: Follow up with behavioral health clinician on : 12/06/21 Behavioral recommendations: FU with LCSWA Referral(s): Integrated [employer] [facility] (In Clinic) and Psychiatrist "From scale of 1-10, how likely are you to follow plan?": 10  Nadja Lina C Alekzander Cardell, LCSW

## 2021-11-10 NOTE — Progress Notes (Signed)
°  Subjective:  Patient ID: William Mayer, male    DOB: 05-13-1991,  MRN: [id number]  Chief Complaint  Patient presents with   Ingrown Toenail    31 y.o. male presents with the above complaint.  Patient presents with complaint of right hallux distal tip pain.  Patient states that been going for few days.  He states that there may be some contusion however he does not recall any trauma to the area.  Started hurting out of nowhere.  He wanted get it evaluated he has not seen anyone else prior to seeing me.   Review of Systems: Negative except as noted in the HPI. Denies N/V/F/Ch.  Past Medical History:  Diagnosis Date   Allergic rhinitis    Seizure (HCC) 01/23/2021    Current Outpatient Medications:    ARIPiprazole (ABILIFY) 10 MG tablet, Take 1 tablet (10 mg total) by mouth daily., Disp: 40 tablet, Rfl: 2   cetirizine (ZYRTEC) 10 MG tablet, Take 1 tablet (10 mg total) by mouth daily., Disp: 30 tablet, Rfl: 11   fluticasone (FLONASE) 50 MCG/ACT nasal spray, Place 1 spray into both nostrils daily., Disp: 16 g, Rfl: 11   ibuprofen (ADVIL) 600 MG tablet, Take 1 tablet (600 mg total) by mouth every 6 (six) hours as needed for moderate pain., Disp: 60 tablet, Rfl: 1   pantoprazole (PROTONIX) 40 MG tablet, Take 1 tablet (40 mg total) by mouth daily., Disp: 30 tablet, Rfl: 3  Social History   Tobacco Use  Smoking Status Light Smoker   Packs/day: 0.25   Types: Cigarettes  Smokeless Tobacco Never  Tobacco Comments   1 or 2    Allergies  Allergen Reactions   Amoxicillin Hives and Itching   Objective:  There were no vitals filed for this visit. There is no height or weight on file to calculate BMI. Constitutional Well developed. Well nourished.  Vascular Dorsalis pedis pulses palpable bilaterally. Posterior tibial pulses palpable bilaterally. Capillary refill normal to all digits.  No cyanosis or clubbing noted. Pedal hair growth normal.  Neurologic Normal speech. Oriented to  person, place, and time. Epicritic sensation to light touch grossly present bilaterally.  Dermatologic Right hallux distal tip pain.  Likely could be due to shoes rubbing against the tip of the toe.  No signs of contusion noted.  No signs of no trauma noted.  Orthopedic: Normal joint ROM without pain or crepitus bilaterally. No visible deformities. No bony tenderness.   Radiographs: None Assessment:   1. Contusion of right great toe without damage to nail, initial encounter    Plan:  Patient was evaluated and treated and all questions answered.  Right hallux distal tip contusion without damage to the nail secondary to shoes -All questions and concerns were discussed with the patient in extensive detail -I extensively discussed shoe gear modification take the pressure off of the distal tip of the toe allow the soft tissue to heal appropriately.  He states understanding -Surgical shoe was dispensed -If there is no improvement I was asked him to come see me.  He states understanding  No follow-ups on file.

## 2021-12-04 ENCOUNTER — Telehealth: Payer: Self-pay | Admitting: Critical Care Medicine

## 2021-12-04 NOTE — Telephone Encounter (Signed)
Copied from [place] [id number]. Topic: General - Other >> Dec 04, 2021  1:38 PM McGill, Nelva Bush wrote: Reason for CRM: Pt's mother stated she would like to speak with SS in regards to the appointment on 12/06/2021. Pt's mother stated that they wish to keep the appointment pt needs to see her, but pt is unable to afford it.  Requesting call back.

## 2021-12-05 NOTE — Telephone Encounter (Signed)
I attempted to return call, no answer,left vm.

## 2021-12-06 ENCOUNTER — Ambulatory Visit: Payer: Medicaid Other | Admitting: Clinical

## 2021-12-15 NOTE — Telephone Encounter (Signed)
I spoke with pt's mother who expressed concerns about receiving a bill while pt has medicaid. I advised her to contact [facility] billing to receive more information. She mentioned that pt drinks alcohol almost daily and she is concerned. I also submitted a referral to [facility] medicine for psychiatry and therapy.

## 2021-12-20 ENCOUNTER — Other Ambulatory Visit: Payer: Self-pay

## 2021-12-22 ENCOUNTER — Other Ambulatory Visit: Payer: Self-pay

## 2022-01-19 ENCOUNTER — Telehealth: Payer: Self-pay | Admitting: Critical Care Medicine

## 2022-01-19 NOTE — Telephone Encounter (Signed)
Called pt to reschedule appt on 5/16.No answer. LVM ?

## 2022-01-23 ENCOUNTER — Other Ambulatory Visit: Payer: Self-pay

## 2022-01-23 ENCOUNTER — Emergency Department (HOSPITAL_COMMUNITY)
Admission: EM | Admit: 2022-01-23 | Discharge: 2022-01-23 | Disposition: A | Discharge status: Home / Self Care | Payer: Medicaid Other | Attending: Emergency Medicine | Admitting: Emergency Medicine

## 2022-01-23 DIAGNOSIS — R410 Disorientation, unspecified: Secondary | ICD-10-CM | POA: Diagnosis not present

## 2022-01-23 DIAGNOSIS — F1910 Other psychoactive substance abuse, uncomplicated: Secondary | ICD-10-CM

## 2022-01-23 DIAGNOSIS — T401X1A Poisoning by heroin, accidental (unintentional), initial encounter: Secondary | ICD-10-CM | POA: Diagnosis not present

## 2022-01-23 LAB — CBC WITH DIFFERENTIAL/PLATELET
Abs Immature Granulocytes: 0.01 10*3/uL (ref 0.00–0.07)
Basophils Absolute: 0 10*3/uL (ref 0.0–0.1)
Basophils Relative: 0 %
Eosinophils Absolute: 0 10*3/uL (ref 0.0–0.5)
Eosinophils Relative: 1 %
HCT: 45.6 % (ref 39.0–52.0)
Hemoglobin: 15.3 g/dL (ref 13.0–17.0)
Immature Granulocytes: 0 %
Lymphocytes Relative: 34 %
Lymphs Abs: 2.6 10*3/uL (ref 0.7–4.0)
MCH: 33.6 pg (ref 26.0–34.0)
MCHC: 33.6 g/dL (ref 30.0–36.0)
MCV: 100.2 fL — ABNORMAL HIGH (ref 80.0–100.0)
Monocytes Absolute: 0.6 10*3/uL (ref 0.1–1.0)
Monocytes Relative: 8 %
Neutro Abs: 4.2 10*3/uL (ref 1.7–7.7)
Neutrophils Relative %: 57 %
Platelets: 245 10*3/uL (ref 150–400)
RBC: 4.55 MIL/uL (ref 4.22–5.81)
RDW: 12.3 % (ref 11.5–15.5)
WBC: 7.5 10*3/uL (ref 4.0–10.5)
nRBC: 0 % (ref 0.0–0.2)

## 2022-01-23 LAB — COMPREHENSIVE METABOLIC PANEL
ALT: 25 U/L (ref 0–44)
AST: 26 U/L (ref 15–41)
Albumin: 4.7 g/dL (ref 3.5–5.0)
Alkaline Phosphatase: 61 U/L (ref 38–126)
Anion gap: 9 (ref 5–15)
BUN: 13 mg/dL (ref 6–20)
CO2: 22 mmol/L (ref 22–32)
Calcium: 9.2 mg/dL (ref 8.9–10.3)
Chloride: 107 mmol/L (ref 98–111)
Creatinine, Ser: 0.96 mg/dL (ref 0.61–1.24)
GFR, Estimated: >60 mL/min (ref >60)
Glucose, Bld: 115 mg/dL — ABNORMAL HIGH (ref 70–99)
Potassium: 3.7 mmol/L (ref 3.5–5.1)
Sodium: 138 mmol/L (ref 135–145)
Total Bilirubin: 0.4 mg/dL (ref 0.3–1.2)
Total Protein: 8.3 g/dL — ABNORMAL HIGH (ref 6.5–8.1)

## 2022-01-23 LAB — RAPID URINE DRUG SCREEN, HOSP PERFORMED
Amphetamines: NOT DETECTED
Barbiturates: NOT DETECTED
Benzodiazepines: NOT DETECTED
Cocaine: POSITIVE — AB
Opiates: NOT DETECTED
Tetrahydrocannabinol: POSITIVE — AB

## 2022-01-23 LAB — SALICYLATE LEVEL: Salicylate Lvl: <7 mg/dL — ABNORMAL LOW (ref 7.0–30.0)

## 2022-01-23 LAB — ETHANOL: Alcohol, Ethyl (B): 16 mg/dL — ABNORMAL HIGH (ref <10)

## 2022-01-23 LAB — ACETAMINOPHEN LEVEL: Acetaminophen (Tylenol), Serum: <10 ug/mL — ABNORMAL LOW (ref 10–30)

## 2022-01-23 MED ORDER — SODIUM CHLORIDE 0.9 % IV BOLUS
1000.0000 mL | Freq: Once | INTRAVENOUS | Status: AC
  Administered 2022-01-23: 1000 mL via INTRAVENOUS

## 2022-01-23 NOTE — ED Notes (Signed)
Given 237 ml of ginger ale and drank it with no issues ?

## 2022-01-23 NOTE — Discharge Instructions (Signed)
Stay hydrated. ? ?Avoid using drugs and alcohol ? ?See your doctor for follow-up ? ?Return to ER if you have drug overdose, alcohol intoxication, lethargy ?

## 2022-01-23 NOTE — ED Provider Notes (Signed)
?[facility]-EMERGENCY DEPT ?Provider Note ? ? ?CSN: [id number] ?Arrival date & time: 01/23/22  2026 ? ?  ? ?History ? ?Chief Complaint  ?Patient presents with  ? Drug Overdose  ? ? ?William Mayer is a 31 y.o. male history of alcohol abuse, adjustment disorder here presenting with drug overdose.  Patient admits to drinking some alcohol tonight.  He was found with a bag of heroin next to his bilateral legs and was noted to be altered.  Patient did not remember using heroin.  Patient denies trying to kill himself. ? ?The history is provided by the patient.  ? ?  ? ?Home Medications ?Prior to Admission medications   ?Medication Sig Start Date End Date Taking? Authorizing Provider  ?ARIPiprazole (ABILIFY) 10 MG tablet Take 1 tablet (10 mg total) by mouth daily. 06/29/21   Storm Frisk, MD  ?cetirizine (ZYRTEC) 10 MG tablet Take 1 tablet (10 mg total) by mouth daily. 06/29/21   Storm Frisk, MD  ?fluticasone (FLONASE) 50 MCG/ACT nasal spray Place 1 spray into both nostrils daily. 06/29/21   Storm Frisk, MD  ?ibuprofen (ADVIL) 600 MG tablet Take 1 tablet (600 mg total) by mouth every 6 (six) hours as needed for moderate pain. 01/23/21   Storm Frisk, MD  ?pantoprazole (PROTONIX) 40 MG tablet Take 1 tablet (40 mg total) by mouth daily. 06/29/21   Storm Frisk, MD  ?OLANZapine (ZYPREXA) 20 MG tablet Take 1 tablet (20 mg total) by mouth daily. Schedule an OV for additional RF's ?Patient not taking: Reported on 08/05/2019 09/24/18 08/05/19  Cain Saupe, MD  ?   ? ?Allergies    ?Amoxicillin   ? ?Review of Systems   ?Review of Systems  ?Psychiatric/Behavioral:  Positive for confusion.   ?All other systems reviewed and are negative. ? ?Physical Exam ?Updated Vital Signs ?BP (!) 145/88   Pulse 86   Temp 97.7 ?F (36.5 ?C) (Oral)   Resp 14   Ht 5\' 5"  (1.651 m)   Wt 63.5 kg   SpO2 98%   BMI 23.30 kg/m?  ?Physical Exam ?Vitals and nursing note reviewed.  ?Constitutional:   ?   Comments:  Slightly confused  ?HENT:  ?   Nose: Nose normal.  ?   Mouth/Throat:  ?   Mouth: Mucous membranes are moist.  ?Eyes:  ?   Comments: Pupils are small but reactive  ?Cardiovascular:  ?   Rate and Rhythm: Normal rate and regular rhythm.  ?   Pulses: Normal pulses.  ?   Heart sounds: Normal heart sounds.  ?Pulmonary:  ?   Effort: Pulmonary effort is normal.  ?   Breath sounds: Normal breath sounds.  ?Abdominal:  ?   General: Abdomen is flat.  ?   Palpations: Abdomen is soft.  ?Musculoskeletal:     ?   General: Normal range of motion.  ?   Cervical back: Normal range of motion and neck supple.  ?Skin: ?   General: Skin is warm.  ?   Capillary Refill: Capillary refill takes less than 2 seconds.  ?Neurological:  ?   General: No focal deficit present.  ?   Comments: Slightly confused, moving all extremities  ?Psychiatric:     ?   Mood and Affect: Mood normal.     ?   Behavior: Behavior normal.  ? ? ?ED Results / Procedures / Treatments   ?Labs ?(all labs ordered are listed, but only abnormal results are displayed) ?Labs  Reviewed  ?CBC WITH DIFFERENTIAL/PLATELET - Abnormal; Notable for the following components:  ?    Result Value  ? MCV 100.2 (*)   ? All other components within normal limits  ?COMPREHENSIVE METABOLIC PANEL - Abnormal; Notable for the following components:  ? Glucose, Bld 115 (*)   ? Total Protein 8.3 (*)   ? All other components within normal limits  ?[name] - Abnormal; Notable for the following components:  ? Alcohol, Ethyl (B) 16 (*)   ? All other components within normal limits  ?SALICYLATE LEVEL - Abnormal; Notable for the following components:  ? Salicylate Lvl <7.0 (*)   ? All other components within normal limits  ?ACETAMINOPHEN LEVEL - Abnormal; Notable for the following components:  ? Acetaminophen (Tylenol), Serum <10 (*)   ? All other components within normal limits  ?RAPID URINE DRUG SCREEN, HOSP PERFORMED - Abnormal; Notable for the following components:  ? Cocaine POSITIVE (*)   ?  Tetrahydrocannabinol POSITIVE (*)   ? All other components within normal limits  ? ? ?EKG ?None ? ?Radiology ?No results found. ? ?Procedures ?Procedures  ? ? ?Medications Ordered in ED ?Medications  ?sodium chloride 0.9 % bolus 1,000 mL (1,000 mLs Intravenous New Bag/Given 01/23/22 2104)  ? ? ?ED Course/ Medical Decision Making/ A&P ?  ?                        ?Medical Decision Making ?William Mayer is a 31 y.o. male here presenting with possible drug overdose.  Patient admits to drinking alcohol but did not admit to using heroin.  Patient was found with some heroin next to him.  Patient adamantly denies trying to kill himself.  We will get CBC and CMP and alcohol level and UDS. ? ?10:18 PM ?Alcohol level is 16.  UDS positive for cocaine and marijuana.  Patient is awake and alert now.  He is able to ambulate to the bathroom by himself and able to tolerate p.o.  At this point, patient is stable for discharge. ? ?Problems Addressed: ?Polysubstance abuse [facility]): acute illness or injury ? ?Amount and/or Complexity of Data Reviewed ?Labs: ordered. Decision-making details documented in ED Course. ? ? ?Final Clinical Impression(s) / ED Diagnoses ?Final diagnoses:  ?None  ? ? ?Rx / DC Orders ?ED Discharge Orders   ? ? None  ? ?  ? ? ?  ?Charlynne Pander, MD ?01/23/22 2220 ? ?

## 2022-01-23 NOTE — ED Triage Notes (Signed)
BIB EMS from home for drug overdose, EMS states police found heroin packets in his cigarette case and confiscated it, on scene was drowsy but states was easy to wake no narcan given ?

## 2022-01-24 ENCOUNTER — Telehealth: Payer: Self-pay | Admitting: Critical Care Medicine

## 2022-01-24 NOTE — Telephone Encounter (Signed)
Lvm to call back to make appt for Hospital follow up.  ?

## 2022-01-24 NOTE — Telephone Encounter (Signed)
Mom called back asking what this appt is for stating she was not aware her son was in the E.R. and needs to know why. Pt's mother was placed on hold for few mins  and Pt's mother hung up.  ?

## 2022-01-25 ENCOUNTER — Telehealth: Payer: Self-pay | Admitting: Critical Care Medicine

## 2022-01-25 NOTE — Telephone Encounter (Signed)
I called the mother and explained what happened  ? ?We need to get him in for OV if possible  ? ?Told her if he becomes worse he needs to go back to ED ?

## 2022-01-25 NOTE — Telephone Encounter (Signed)
Copied from CRM [id number]. Topic: General - Other ?>> Jan 25, 2022 10:45 AM Jaquita Rector A wrote: ?Reason for CRM: Patient mom Holly Bodily called in needing to speak to Dr Delford Field or his nurse mom is aware that son was seen in the ED on 01/23/22 but she does not have details of his visit and very worried due to sons mental health issues she is his guardian and would like a call back from Dr Delford Field or nurse please ASAP can be reached at Ph# [phone] ?

## 2022-01-25 NOTE — Telephone Encounter (Signed)
I called and left a vm, Routing PCP as well  ?

## 2022-01-26 NOTE — Telephone Encounter (Signed)
Called and made appt

## 2022-02-11 ENCOUNTER — Other Ambulatory Visit: Payer: Self-pay

## 2022-02-11 ENCOUNTER — Emergency Department (HOSPITAL_COMMUNITY): Payer: Medicaid Other

## 2022-02-11 ENCOUNTER — Emergency Department (HOSPITAL_COMMUNITY)
Admission: EM | Admit: 2022-02-11 | Discharge: 2022-02-11 | Disposition: A | Discharge status: Home / Self Care | Payer: Medicaid Other | Attending: Emergency Medicine | Admitting: Emergency Medicine

## 2022-02-11 DIAGNOSIS — S40812A Abrasion of left upper arm, initial encounter: Secondary | ICD-10-CM | POA: Diagnosis not present

## 2022-02-11 DIAGNOSIS — S80811A Abrasion, right lower leg, initial encounter: Secondary | ICD-10-CM | POA: Insufficient documentation

## 2022-02-11 DIAGNOSIS — R5383 Other fatigue: Secondary | ICD-10-CM | POA: Insufficient documentation

## 2022-02-11 DIAGNOSIS — S80812A Abrasion, left lower leg, initial encounter: Secondary | ICD-10-CM | POA: Insufficient documentation

## 2022-02-11 DIAGNOSIS — D72829 Elevated white blood cell count, unspecified: Secondary | ICD-10-CM | POA: Diagnosis not present

## 2022-02-11 DIAGNOSIS — S025XXA Fracture of tooth (traumatic), initial encounter for closed fracture: Secondary | ICD-10-CM | POA: Diagnosis not present

## 2022-02-11 DIAGNOSIS — S1081XA Abrasion of other specified part of neck, initial encounter: Secondary | ICD-10-CM | POA: Diagnosis not present

## 2022-02-11 DIAGNOSIS — S40811A Abrasion of right upper arm, initial encounter: Secondary | ICD-10-CM | POA: Insufficient documentation

## 2022-02-11 DIAGNOSIS — R000 Tachycardia, unspecified: Secondary | ICD-10-CM | POA: Diagnosis not present

## 2022-02-11 DIAGNOSIS — S0993XA Unspecified injury of face, initial encounter: Secondary | ICD-10-CM | POA: Diagnosis present

## 2022-02-11 DIAGNOSIS — R780 Finding of alcohol in blood: Secondary | ICD-10-CM | POA: Insufficient documentation

## 2022-02-11 DIAGNOSIS — R824 Acetonuria: Secondary | ICD-10-CM | POA: Insufficient documentation

## 2022-02-11 DIAGNOSIS — N179 Acute kidney failure, unspecified: Secondary | ICD-10-CM | POA: Diagnosis not present

## 2022-02-11 DIAGNOSIS — S40011A Contusion of right shoulder, initial encounter: Secondary | ICD-10-CM

## 2022-02-11 DIAGNOSIS — S0083XA Contusion of other part of head, initial encounter: Secondary | ICD-10-CM

## 2022-02-11 DIAGNOSIS — Y906 Blood alcohol level of 120-199 mg/100 ml: Secondary | ICD-10-CM | POA: Diagnosis not present

## 2022-02-11 LAB — I-STAT CHEM 8, ED
BUN: 15 mg/dL (ref 6–20)
Calcium, Ion: 1.13 mmol/L — ABNORMAL LOW (ref 1.15–1.40)
Chloride: 108 mmol/L (ref 98–111)
Creatinine, Ser: 1.8 mg/dL — ABNORMAL HIGH (ref 0.61–1.24)
Glucose, Bld: 141 mg/dL — ABNORMAL HIGH (ref 70–99)
HCT: 50 % (ref 39.0–52.0)
Hemoglobin: 17 g/dL (ref 13.0–17.0)
Potassium: 4.5 mmol/L (ref 3.5–5.1)
Sodium: 141 mmol/L (ref 135–145)
TCO2: 17 mmol/L — ABNORMAL LOW (ref 22–32)

## 2022-02-11 LAB — URINALYSIS, ROUTINE W REFLEX MICROSCOPIC
Bacteria, UA: NONE SEEN
Bilirubin Urine: NEGATIVE
Glucose, UA: NEGATIVE mg/dL
Ketones, ur: 5 mg/dL — AB
Leukocytes,Ua: NEGATIVE
Nitrite: NEGATIVE
Protein, ur: 30 mg/dL — AB
Specific Gravity, Urine: 1.033 — ABNORMAL HIGH (ref 1.005–1.030)
pH: 5 (ref 5.0–8.0)

## 2022-02-11 LAB — COMPREHENSIVE METABOLIC PANEL
ALT: 23 U/L (ref 0–44)
AST: 49 U/L — ABNORMAL HIGH (ref 15–41)
Albumin: 4.4 g/dL (ref 3.5–5.0)
Alkaline Phosphatase: 58 U/L (ref 38–126)
Anion gap: 19 — ABNORMAL HIGH (ref 5–15)
BUN: 14 mg/dL (ref 6–20)
CO2: 14 mmol/L — ABNORMAL LOW (ref 22–32)
Calcium: 9.4 mg/dL (ref 8.9–10.3)
Chloride: 105 mmol/L (ref 98–111)
Creatinine, Ser: 1.61 mg/dL — ABNORMAL HIGH (ref 0.61–1.24)
GFR, Estimated: 28 mL/min — ABNORMAL LOW (ref >60)
Glucose, Bld: 146 mg/dL — ABNORMAL HIGH (ref 70–99)
Potassium: 4.5 mmol/L (ref 3.5–5.1)
Sodium: 138 mmol/L (ref 135–145)
Total Bilirubin: 0.6 mg/dL (ref 0.3–1.2)
Total Protein: 7.7 g/dL (ref 6.5–8.1)

## 2022-02-11 LAB — CBC
HCT: 48.8 % (ref 39.0–52.0)
Hemoglobin: 16.1 g/dL (ref 13.0–17.0)
MCH: 32.9 pg (ref 26.0–34.0)
MCHC: 33 g/dL (ref 30.0–36.0)
MCV: 99.6 fL (ref 80.0–100.0)
Platelets: 280 10*3/uL (ref 150–400)
RBC: 4.9 MIL/uL (ref 4.22–5.81)
RDW: 11.6 % (ref 11.5–15.5)
WBC: 10.7 10*3/uL — ABNORMAL HIGH (ref 4.0–10.5)
nRBC: 0 % (ref 0.0–0.2)

## 2022-02-11 LAB — SAMPLE TO BLOOD BANK

## 2022-02-11 LAB — PROTIME-INR
INR: 1 (ref 0.8–1.2)
Prothrombin Time: 13.3 seconds (ref 11.4–15.2)

## 2022-02-11 LAB — ETHANOL: Alcohol, Ethyl (B): 191 mg/dL — ABNORMAL HIGH (ref <10)

## 2022-02-11 LAB — LACTIC ACID, PLASMA: Lactic Acid, Venous: >9 mmol/L (ref 0.5–1.9)

## 2022-02-11 MED ORDER — LACTATED RINGERS IV BOLUS
1000.0000 mL | Freq: Once | INTRAVENOUS | Status: AC
  Administered 2022-02-11: 1000 mL via INTRAVENOUS

## 2022-02-11 MED ORDER — IOHEXOL 300 MG/ML  SOLN
80.0000 mL | Freq: Once | INTRAMUSCULAR | Status: AC | PRN
  Administered 2022-02-11: 80 mL via INTRAVENOUS

## 2022-02-11 MED ORDER — LACTATED RINGERS IV SOLN: INTRAVENOUS | Status: DC

## 2022-02-11 MED ORDER — IBUPROFEN 800 MG PO TABS: 800.0000 mg | ORAL_TABLET | Freq: Three times a day (TID) | ORAL | 0 refills | Status: DC | PRN

## 2022-02-11 MED ORDER — KETOROLAC TROMETHAMINE 15 MG/ML IJ SOLN
15.0000 mg | Freq: Once | INTRAMUSCULAR | Status: AC
  Administered 2022-02-11: 15 mg via INTRAVENOUS
  Filled 2022-02-11: qty 1

## 2022-02-11 NOTE — Progress Notes (Incomplete)
? ?Established Patient Office Visit ? ?Subjective:  ?Patient ID: William Mayer, male    DOB: Mar 22, 1991  Age: 31 y.o. MRN: [id number] ? ?CC:  ?No chief complaint on file. ? ? ? ?HPI ?William Mayer presents for post hospital follow-up alcohol use and schizoaffective disorder.  Since the last visit in March the patient was in the emergency room in June for alcohol intoxication.  Below is documentation of that visit. ? ?William Mayer is a 31 y.o. male w PMHx severe alcohol dependence, presenting to the ED via EMS from home for AMS. It is reported in triage documentation that patient mother thought he "was not acting right" after returning home from work, but patient apparently admitted to drinking liquor and beer today.  ?  ?LEVEL 5 CAVEAT 2/t AMS. Patient is arousable but not answering many questions. Endorses alcohol use but will not tell me what kind or how much. Denies drug use.  ?  ?The history is provided by the patient. The history is limited by the condition of the patient.  ? Patient with hx of alcohol dependence, brought in by mother for AMS. He appears intoxicated however due to limited history and acute alteration in mental status, will obtain some blood work for [employer], screening labs. IVF and zofran ordered.  ?  ?[name] levels not remarkably high.  Due to patient's mental status, CT head is obtained and is negative.  On reevaluation he is much more conversant and alert.  With encouragement he is more talkative.  He is oriented.  He endorses alcohol use today, denies drug use.  Denies injuries today..  Requests something to eat.  He is ambulating with steady gait without assistance.  His mother arrives to take him home, believe he is appropriate for discharge to home in her care. ? ?The patient has been taking his Abilify but does need refills.  He is yet to achieve a visit at the [facility].  We made the referral back in March he is yet to achieve this.  He has not  had recurrent seizures.  He is still taking alcohol and will need further follow-up with licensed clinical social work. ? ?Patient does have allergic rhinitis and is needing refills on cetirizine and Flonase.  His stomach acid situation is improved on reflux medications.  His low back pain has resolved. ? ?4/17 ?  ? Respiratory  ? Allergic rhinitis  ?  Chronic allergic rhinitis we will refill Flonase and cetirizine ?  ?  ? Relevant Medications  ? cetirizine (ZYRTEC) 10 MG tablet  ? fluticasone (FLONASE) 50 MCG/ACT nasal spray  ?  ? Digestive  ? GERD (gastroesophageal reflux disease)  ?  Chronic reflux disease I did refill pantoprazole ?  ?  ? Relevant Medications  ? pantoprazole (PROTONIX) 40 MG tablet  ?  ? Other  ? Psychotic affective disorder (HCC) - Primary  ?  Patient is yet to achieve mental health clinic visit we will refill Abilify and have given him another opportunity to get to the behavioral health clinic gave him information on how to achieve ?  ?  ? Alcohol use disorder, severe, dependence (HCC)  ?  Significant alcohol use disorder still ongoing recent admission to the ER in June for same ? ?Referral to licensed clinical social worker made ?  ?  ? Diabetes mellitus screening  ?  Due to mental health condition hemoglobin A1c was asked to be checked it is normal he  does not have diabetes ?  ?  ? Relevant Orders  ? POCT glycosylated hemoglobin (Hb A1C) (Completed)  ? ?Past Medical History:  ?Diagnosis Date  ?? Allergic rhinitis   ?? Seizure (HCC) 01/23/2021  ? ? ?Past Surgical History:  ?Procedure Laterality Date  ?? NO PAST SURGERIES    ? ? ?Family History  ?Problem Relation Age of Onset  ?? Gout Father   ?? Arthritis Father   ? ? ?Social History  ? ?Socioeconomic History  ?? Marital status: Single  ?  Spouse name: Not on file  ?? Number of children: Not on file  ?? Years of education: Not on file  ?? Highest education level: Not on file  ?Occupational History  ?? Not on file  ?Tobacco Use  ?? Smoking  status: Light Smoker  ?  Packs/day: 0.25  ?  Types: Cigarettes  ?? Smokeless tobacco: Never  ?? Tobacco comments:  ?  1 or 2  ?Vaping Use  ?? Vaping Use: Never used  ?Substance and Sexual Activity  ?? Alcohol use: Yes  ?  Comment: Daily. Last drink: last night   ?? Drug use: Yes  ?  Types: Marijuana  ?  Comment: Last used: yesterday   ?? Sexual activity: Yes  ?Other Topics Concern  ?? Not on file  ?Social History Narrative  ?? Not on file  ? ?Social Determinants of Health  ? ?Financial Resource Strain: Not on file  ?Food Insecurity: Not on file  ?Transportation Needs: Not on file  ?Physical Activity: Not on file  ?Stress: Not on file  ?Social Connections: Not on file  ?Intimate Partner Violence: Not on file  ? ? ?Outpatient Medications Prior to Visit  ?Medication Sig Dispense Refill  ?? ARIPiprazole (ABILIFY) 10 MG tablet Take 1 tablet (10 mg total) by mouth daily. 40 tablet 2  ?? cetirizine (ZYRTEC) 10 MG tablet Take 1 tablet (10 mg total) by mouth daily. 30 tablet 11  ?? fluticasone (FLONASE) 50 MCG/ACT nasal spray Place 1 spray into both nostrils daily. 16 g 11  ?? ibuprofen (ADVIL) 600 MG tablet Take 1 tablet (600 mg total) by mouth every 6 (six) hours as needed for moderate pain. 60 tablet 1  ?? pantoprazole (PROTONIX) 40 MG tablet Take 1 tablet (40 mg total) by mouth daily. 30 tablet 3  ? ?No facility-administered medications prior to visit.  ? ? ?Allergies  ?Allergen Reactions  ?? Amoxicillin Hives and Itching  ? ? ?ROS ?Review of Systems  ?Constitutional:  Negative for chills, diaphoresis and fever.  ?HENT:  Positive for congestion, postnasal drip and rhinorrhea. Negative for ear discharge, ear pain, hearing loss, nosebleeds, sore throat and tinnitus.   ?Eyes:  Negative for photophobia and discharge.  ?Respiratory:  Negative for cough, shortness of breath, wheezing and stridor.   ?     No excess mucus  ?Cardiovascular:  Negative for chest pain, palpitations and leg swelling.  ?Gastrointestinal:  Negative for  abdominal pain, blood in stool, constipation, diarrhea, nausea and vomiting.  ?Endocrine: Negative for polydipsia.  ?Genitourinary:  Negative for dysuria, flank pain, frequency, hematuria and urgency.  ?Musculoskeletal:  Negative for back pain, myalgias and neck pain.  ?Skin:  Negative for rash.  ?Allergic/Immunologic: Negative for environmental allergies.  ?Neurological:  Negative for dizziness, tremors, seizures, weakness and headaches.  ?Hematological:  Does not bruise/bleed easily.  ?Psychiatric/Behavioral:  Positive for behavioral problems and decreased concentration. Negative for hallucinations and suicidal ideas. The patient is nervous/anxious.   ?All other systems  reviewed and are negative. ? ?  ?Objective:  ?  ?Physical Exam ?Vitals reviewed.  ?Constitutional:   ?   Appearance: Normal appearance. He is well-developed. He is not diaphoretic.  ?HENT:  ?   Head: Normocephalic and atraumatic.  ?   Nose: No nasal deformity, septal deviation, mucosal edema or rhinorrhea.  ?   Right Sinus: No maxillary sinus tenderness or frontal sinus tenderness.  ?   Left Sinus: No maxillary sinus tenderness or frontal sinus tenderness.  ?   Mouth/Throat:  ?   Pharynx: No oropharyngeal exudate.  ?Eyes:  ?   General: No scleral icterus. ?   Conjunctiva/sclera: Conjunctivae normal.  ?   Pupils: Pupils are equal, round, and reactive to light.  ?Neck:  ?   Thyroid: No thyromegaly.  ?   Vascular: No carotid bruit or JVD.  ?   Trachea: Trachea normal. No tracheal tenderness or tracheal deviation.  ?Cardiovascular:  ?   Rate and Rhythm: Normal rate and regular rhythm.  ?   Chest Wall: PMI is not displaced.  ?   Pulses: Normal pulses. No decreased pulses.  ?   Heart sounds: Normal heart sounds, S1 normal and S2 normal. Heart sounds not distant. No murmur heard. ?No systolic murmur is present.  ?No diastolic murmur is present.  ?  No friction rub. No gallop. No S3 or S4 sounds.  ?Pulmonary:  ?   Effort: No tachypnea, accessory muscle  usage or respiratory distress.  ?   Breath sounds: No stridor. No decreased breath sounds, wheezing, rhonchi or rales.  ?Chest:  ?   Chest wall: No tenderness.  ?Abdominal:  ?   General: Bowel sounds are n

## 2022-02-11 NOTE — Progress Notes (Signed)
Orthopedic Tech Progress Note ?Patient Details:  ?William Mayer ?10/29/1877 ?[id number] ? ?Level 2 trauma ? ?Patient ID: William Mayer, male   DOB: 10/29/1877, 31 y.o.   MRN: [id number] ? ?Eulah Walkup Carmine Savoy ?02/11/2022, 7:21 PM ? ?

## 2022-02-11 NOTE — ED Notes (Signed)
Warming blanket not needed per MD Plunkett  ?
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
## 2022-02-11 NOTE — ED Notes (Signed)
EDP at bedside  
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
## 2022-02-11 NOTE — Discharge Instructions (Addendum)
Thankfully there is no internal bleeding in your head today and no internal injuries to your chest abdomen hips or shoulder.  You just have a very bad bruise of your right shoulder but no evidence of dislocation or fracture.  You are going to be very sore for the next few days so you should use Tylenol or ibuprofen.  Make sure you are staying well-hydrated.  Eat a soft diet because of your teeth and you will need to see the dentist.  Hopefully they can get you in in the next few days. ?

## 2022-02-11 NOTE — ED Notes (Signed)
Pt taken to CT.

## 2022-02-11 NOTE — ED Notes (Signed)
EDP at bedside. C collar taken off. Pt given ice for shoulder. Pt visibly frustrated  ?
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
## 2022-02-11 NOTE — ED Notes (Signed)
MD Plunkett notified of critical lactic  ?

## 2022-02-11 NOTE — ED Notes (Signed)
Patient verbalizes understanding of discharge instructions. Opportunity for questioning and answers were provided. Armband removed by staff, pt discharged from ED ambulatory.   
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
## 2022-02-12 ENCOUNTER — Ambulatory Visit: Payer: Medicaid Other | Admitting: Critical Care Medicine

## 2022-03-13 ENCOUNTER — Encounter: Payer: Medicaid Other | Admitting: Critical Care Medicine

## 2022-04-21 ENCOUNTER — Emergency Department (HOSPITAL_COMMUNITY): Payer: Medicaid Other

## 2022-04-21 ENCOUNTER — Other Ambulatory Visit: Payer: Self-pay

## 2022-04-21 ENCOUNTER — Emergency Department (HOSPITAL_COMMUNITY)
Admission: EM | Admit: 2022-04-21 | Discharge: 2022-04-21 | Disposition: A | Discharge status: Home / Self Care | Payer: Medicaid Other | Attending: Emergency Medicine | Admitting: Emergency Medicine

## 2022-04-21 DIAGNOSIS — Y906 Blood alcohol level of 120-199 mg/100 ml: Secondary | ICD-10-CM | POA: Diagnosis not present

## 2022-04-21 DIAGNOSIS — E872 Acidosis, unspecified: Secondary | ICD-10-CM

## 2022-04-21 DIAGNOSIS — E8721 Acute metabolic acidosis: Secondary | ICD-10-CM | POA: Diagnosis not present

## 2022-04-21 DIAGNOSIS — F10120 Alcohol abuse with intoxication, uncomplicated: Secondary | ICD-10-CM | POA: Diagnosis not present

## 2022-04-21 DIAGNOSIS — R4182 Altered mental status, unspecified: Secondary | ICD-10-CM

## 2022-04-21 DIAGNOSIS — F10920 Alcohol use, unspecified with intoxication, uncomplicated: Secondary | ICD-10-CM

## 2022-04-21 DIAGNOSIS — E162 Hypoglycemia, unspecified: Secondary | ICD-10-CM | POA: Diagnosis not present

## 2022-04-21 LAB — BASIC METABOLIC PANEL
Anion gap: 8 (ref 5–15)
BUN: 16 mg/dL (ref 6–20)
CO2: 23 mmol/L (ref 22–32)
Calcium: 8.6 mg/dL — ABNORMAL LOW (ref 8.9–10.3)
Chloride: 106 mmol/L (ref 98–111)
Creatinine, Ser: 1.04 mg/dL (ref 0.61–1.24)
GFR, Estimated: >60 mL/min (ref >60)
Glucose, Bld: 101 mg/dL — ABNORMAL HIGH (ref 70–99)
Potassium: 3.8 mmol/L (ref 3.5–5.1)
Sodium: 137 mmol/L (ref 135–145)

## 2022-04-21 LAB — CBC WITH DIFFERENTIAL/PLATELET
Abs Immature Granulocytes: 0.07 10*3/uL (ref 0.00–0.07)
Basophils Absolute: 0 10*3/uL (ref 0.0–0.1)
Basophils Relative: 0 %
Eosinophils Absolute: 0 10*3/uL (ref 0.0–0.5)
Eosinophils Relative: 0 %
HCT: 43.2 % (ref 39.0–52.0)
Hemoglobin: 13.9 g/dL (ref 13.0–17.0)
Immature Granulocytes: 1 %
Lymphocytes Relative: 14 %
Lymphs Abs: 1.4 10*3/uL (ref 0.7–4.0)
MCH: 33 pg (ref 26.0–34.0)
MCHC: 32.2 g/dL (ref 30.0–36.0)
MCV: 102.6 fL — ABNORMAL HIGH (ref 80.0–100.0)
Monocytes Absolute: 0.6 10*3/uL (ref 0.1–1.0)
Monocytes Relative: 6 %
Neutro Abs: 7.8 10*3/uL — ABNORMAL HIGH (ref 1.7–7.7)
Neutrophils Relative %: 79 %
Platelets: 291 10*3/uL (ref 150–400)
RBC: 4.21 MIL/uL — ABNORMAL LOW (ref 4.22–5.81)
RDW: 12.1 % (ref 11.5–15.5)
WBC: 9.9 10*3/uL (ref 4.0–10.5)
nRBC: 0 % (ref 0.0–0.2)

## 2022-04-21 LAB — COMPREHENSIVE METABOLIC PANEL
ALT: 31 U/L (ref 0–44)
AST: 117 U/L — ABNORMAL HIGH (ref 15–41)
Albumin: 3.9 g/dL (ref 3.5–5.0)
Alkaline Phosphatase: 72 U/L (ref 38–126)
Anion gap: 16 — ABNORMAL HIGH (ref 5–15)
BUN: 23 mg/dL — ABNORMAL HIGH (ref 6–20)
CO2: 15 mmol/L — ABNORMAL LOW (ref 22–32)
Calcium: 8.4 mg/dL — ABNORMAL LOW (ref 8.9–10.3)
Chloride: 107 mmol/L (ref 98–111)
Creatinine, Ser: 1.21 mg/dL (ref 0.61–1.24)
GFR, Estimated: >60 mL/min (ref >60)
Glucose, Bld: 182 mg/dL — ABNORMAL HIGH (ref 70–99)
Potassium: 3.9 mmol/L (ref 3.5–5.1)
Sodium: 138 mmol/L (ref 135–145)
Total Bilirubin: 0.6 mg/dL (ref 0.3–1.2)
Total Protein: 6.9 g/dL (ref 6.5–8.1)

## 2022-04-21 LAB — CBG MONITORING, ED
Glucose-Capillary: 249 mg/dL — ABNORMAL HIGH (ref 70–99)
Glucose-Capillary: 58 mg/dL — ABNORMAL LOW (ref 70–99)
Glucose-Capillary: 113 mg/dL — ABNORMAL HIGH (ref 70–99)
Glucose-Capillary: 100 mg/dL — ABNORMAL HIGH (ref 70–99)
Glucose-Capillary: 84 mg/dL (ref 70–99)
Glucose-Capillary: 97 mg/dL (ref 70–99)

## 2022-04-21 LAB — ETHANOL: Alcohol, Ethyl (B): 193 mg/dL — ABNORMAL HIGH (ref <10)

## 2022-04-21 MED ORDER — LACTATED RINGERS IV BOLUS
1000.0000 mL | Freq: Once | INTRAVENOUS | Status: AC
  Administered 2022-04-21: 1000 mL via INTRAVENOUS

## 2022-04-21 NOTE — ED Notes (Signed)
Pt made aware of need for urine sample, states he is unable to provide one at this time.

## 2022-04-21 NOTE — ED Notes (Addendum)
Patient unable to answer questions clearly. Pt talking with self and laughing, some spoken sentences with profanities. Easily reoriented to current situation.
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
## 2022-04-21 NOTE — ED Notes (Signed)
ED Provider at bedside. 

## 2022-07-06 ENCOUNTER — Emergency Department (HOSPITAL_COMMUNITY)
Admission: EM | Admit: 2022-07-06 | Discharge: 2022-07-06 | Disposition: A | Discharge status: Home / Self Care | Payer: Medicaid Other | Attending: Emergency Medicine | Admitting: Emergency Medicine

## 2022-07-06 ENCOUNTER — Other Ambulatory Visit: Payer: Self-pay

## 2022-07-06 ENCOUNTER — Encounter (HOSPITAL_COMMUNITY): Payer: Self-pay

## 2022-07-06 DIAGNOSIS — Y907 Blood alcohol level of 200-239 mg/100 ml: Secondary | ICD-10-CM | POA: Diagnosis not present

## 2022-07-06 DIAGNOSIS — Z79899 Other long term (current) drug therapy: Secondary | ICD-10-CM | POA: Insufficient documentation

## 2022-07-06 DIAGNOSIS — F10920 Alcohol use, unspecified with intoxication, uncomplicated: Secondary | ICD-10-CM | POA: Insufficient documentation

## 2022-07-06 DIAGNOSIS — D72829 Elevated white blood cell count, unspecified: Secondary | ICD-10-CM | POA: Insufficient documentation

## 2022-07-06 DIAGNOSIS — E11649 Type 2 diabetes mellitus with hypoglycemia without coma: Secondary | ICD-10-CM | POA: Insufficient documentation

## 2022-07-06 DIAGNOSIS — R4182 Altered mental status, unspecified: Secondary | ICD-10-CM | POA: Diagnosis present

## 2022-07-06 LAB — CBC WITH DIFFERENTIAL/PLATELET
Abs Immature Granulocytes: 0.08 10*3/uL — ABNORMAL HIGH (ref 0.00–0.07)
Basophils Absolute: 0 10*3/uL (ref 0.0–0.1)
Basophils Relative: 0 %
Eosinophils Absolute: 0 10*3/uL (ref 0.0–0.5)
Eosinophils Relative: 0 %
HCT: 45.2 % (ref 39.0–52.0)
Hemoglobin: 14.6 g/dL (ref 13.0–17.0)
Immature Granulocytes: 1 %
Lymphocytes Relative: 9 %
Lymphs Abs: 1.1 10*3/uL (ref 0.7–4.0)
MCH: 32.6 pg (ref 26.0–34.0)
MCHC: 32.3 g/dL (ref 30.0–36.0)
MCV: 100.9 fL — ABNORMAL HIGH (ref 80.0–100.0)
Monocytes Absolute: 0.5 10*3/uL (ref 0.1–1.0)
Monocytes Relative: 4 %
Neutro Abs: 10.6 10*3/uL — ABNORMAL HIGH (ref 1.7–7.7)
Neutrophils Relative %: 86 %
Platelets: 241 10*3/uL (ref 150–400)
RBC: 4.48 MIL/uL (ref 4.22–5.81)
RDW: 11.9 % (ref 11.5–15.5)
WBC: 12.4 10*3/uL — ABNORMAL HIGH (ref 4.0–10.5)
nRBC: 0 % (ref 0.0–0.2)

## 2022-07-06 LAB — RAPID URINE DRUG SCREEN, HOSP PERFORMED
Amphetamines: NOT DETECTED
Barbiturates: NOT DETECTED
Benzodiazepines: NOT DETECTED
Cocaine: POSITIVE — AB
Opiates: NOT DETECTED
Tetrahydrocannabinol: POSITIVE — AB

## 2022-07-06 LAB — COMPREHENSIVE METABOLIC PANEL
ALT: 30 U/L (ref 0–44)
AST: 118 U/L — ABNORMAL HIGH (ref 15–41)
Albumin: 4.3 g/dL (ref 3.5–5.0)
Alkaline Phosphatase: 69 U/L (ref 38–126)
Anion gap: 13 (ref 5–15)
BUN: 18 mg/dL (ref 6–20)
CO2: 18 mmol/L — ABNORMAL LOW (ref 22–32)
Calcium: 8.4 mg/dL — ABNORMAL LOW (ref 8.9–10.3)
Chloride: 103 mmol/L (ref 98–111)
Creatinine, Ser: 1.19 mg/dL (ref 0.61–1.24)
GFR, Estimated: >60 mL/min (ref >60)
Glucose, Bld: 166 mg/dL — ABNORMAL HIGH (ref 70–99)
Potassium: 3.6 mmol/L (ref 3.5–5.1)
Sodium: 134 mmol/L — ABNORMAL LOW (ref 135–145)
Total Bilirubin: 0.4 mg/dL (ref 0.3–1.2)
Total Protein: 7.8 g/dL (ref 6.5–8.1)

## 2022-07-06 LAB — CBG MONITORING, ED: Glucose-Capillary: 176 mg/dL — ABNORMAL HIGH (ref 70–99)

## 2022-07-06 LAB — ETHANOL: Alcohol, Ethyl (B): 223 mg/dL — ABNORMAL HIGH (ref <10)

## 2022-07-06 NOTE — Discharge Instructions (Signed)
You are seen in the emergency department.  Please drink plenty of water, abstain from any alcohol consumption or illicit drug use.

## 2022-07-06 NOTE — ED Notes (Signed)
Pt states understanding of dc instructions, importance of follow up.  Pt denies questions or concerns upon dc. Pt declined wheelchair assistance upon dc. Pt ambulated out of ed w/ steady gait. No belongings left in room upon dc.  
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
## 2022-07-06 NOTE — ED Notes (Addendum)
Pt ambulatory to bathroom w/o assist. Steady gait. No complaints

## 2022-07-06 NOTE — ED Notes (Signed)
Pt given ham sandwich. 

## 2022-07-06 NOTE — ED Triage Notes (Signed)
Pt bib ems for etoh, AMS, and hypoglycemia.  Per ems initial CBG 54, final cbg per ems 253.  Pt altered upon arrival to ed and smells strongly of etoh.

## 2022-08-02 ENCOUNTER — Ambulatory Visit: Payer: Medicaid Other | Admitting: Physician Assistant

## 2022-08-03 ENCOUNTER — Ambulatory Visit: Payer: Self-pay | Admitting: *Deleted

## 2022-08-03 DIAGNOSIS — U071 COVID-19: Secondary | ICD-10-CM

## 2022-08-03 MED ORDER — NIRMATRELVIR/RITONAVIR (PAXLOVID)TABLET: 3.0000 | ORAL_TABLET | Freq: Two times a day (BID) | ORAL | 0 refills | Status: AC

## 2022-08-03 NOTE — Addendum Note (Signed)
Addended by: Asencion Noble E on: 08/03/2022 04:13 PM   Modules accepted: Orders

## 2022-08-03 NOTE — Telephone Encounter (Signed)
Let pt know i will send rx for paxlovid to [facility] as our pharmacy closing soon

## 2022-08-03 NOTE — Telephone Encounter (Signed)
Summary: covid positive   Pt is covid positive.  Pt has a terrible cough, runny nose, congestion. Mom and dad are covid positive. She thinks he got covid around [place]. His symptoms do not seem to be getting better.  Mom started paxlovid on Tues.  She hopes he can get some too.       Called patient to review sx of covid positive and request for paxlovid. No answer, LVMTCB [phone].
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
## 2022-08-03 NOTE — Telephone Encounter (Signed)
Patient mother will p/u Rx from pharmacy.

## 2022-08-08 ENCOUNTER — Other Ambulatory Visit: Payer: Self-pay

## 2022-08-08 ENCOUNTER — Encounter (HOSPITAL_COMMUNITY): Payer: Self-pay | Admitting: Emergency Medicine

## 2022-08-08 ENCOUNTER — Emergency Department (HOSPITAL_COMMUNITY)
Admission: EM | Admit: 2022-08-08 | Discharge: 2022-08-10 | Disposition: A | Discharge status: Home / Self Care | Payer: Medicaid Other | Attending: Emergency Medicine | Admitting: Emergency Medicine

## 2022-08-08 DIAGNOSIS — Z1152 Encounter for screening for COVID-19: Secondary | ICD-10-CM | POA: Insufficient documentation

## 2022-08-08 DIAGNOSIS — R4689 Other symptoms and signs involving appearance and behavior: Secondary | ICD-10-CM | POA: Insufficient documentation

## 2022-08-08 DIAGNOSIS — F22 Delusional disorders: Secondary | ICD-10-CM | POA: Insufficient documentation

## 2022-08-08 DIAGNOSIS — R4182 Altered mental status, unspecified: Secondary | ICD-10-CM | POA: Insufficient documentation

## 2022-08-08 DIAGNOSIS — R000 Tachycardia, unspecified: Secondary | ICD-10-CM | POA: Insufficient documentation

## 2022-08-08 DIAGNOSIS — R443 Hallucinations, unspecified: Secondary | ICD-10-CM | POA: Diagnosis not present

## 2022-08-08 DIAGNOSIS — R451 Restlessness and agitation: Secondary | ICD-10-CM | POA: Insufficient documentation

## 2022-08-08 DIAGNOSIS — F1994 Other psychoactive substance use, unspecified with psychoactive substance-induced mood disorder: Secondary | ICD-10-CM | POA: Diagnosis present

## 2022-08-08 LAB — CBC
HCT: 46.2 % (ref 39.0–52.0)
Hemoglobin: 15.2 g/dL (ref 13.0–17.0)
MCH: 32.3 pg (ref 26.0–34.0)
MCHC: 32.9 g/dL (ref 30.0–36.0)
MCV: 98.1 fL (ref 80.0–100.0)
Platelets: 306 10*3/uL (ref 150–400)
RBC: 4.71 MIL/uL (ref 4.22–5.81)
RDW: 11.9 % (ref 11.5–15.5)
WBC: 10.5 10*3/uL (ref 4.0–10.5)
nRBC: 0 % (ref 0.0–0.2)

## 2022-08-08 LAB — COMPREHENSIVE METABOLIC PANEL
ALT: 19 U/L (ref 0–44)
AST: 44 U/L — ABNORMAL HIGH (ref 15–41)
Albumin: 4.5 g/dL (ref 3.5–5.0)
Alkaline Phosphatase: 74 U/L (ref 38–126)
Anion gap: 17 — ABNORMAL HIGH (ref 5–15)
BUN: 24 mg/dL — ABNORMAL HIGH (ref 6–20)
CO2: 15 mmol/L — ABNORMAL LOW (ref 22–32)
Calcium: 9.3 mg/dL (ref 8.9–10.3)
Chloride: 103 mmol/L (ref 98–111)
Creatinine, Ser: 1.31 mg/dL — ABNORMAL HIGH (ref 0.61–1.24)
GFR, Estimated: >60 mL/min (ref >60)
Glucose, Bld: 120 mg/dL — ABNORMAL HIGH (ref 70–99)
Potassium: 4.1 mmol/L (ref 3.5–5.1)
Sodium: 135 mmol/L (ref 135–145)
Total Bilirubin: 0.6 mg/dL (ref 0.3–1.2)
Total Protein: 8.1 g/dL (ref 6.5–8.1)

## 2022-08-08 LAB — CBG MONITORING, ED: Glucose-Capillary: 94 mg/dL (ref 70–99)

## 2022-08-08 LAB — BASIC METABOLIC PANEL
Anion gap: 8 (ref 5–15)
BUN: 22 mg/dL — ABNORMAL HIGH (ref 6–20)
CO2: 24 mmol/L (ref 22–32)
Calcium: 9.3 mg/dL (ref 8.9–10.3)
Chloride: 104 mmol/L (ref 98–111)
Creatinine, Ser: 1.07 mg/dL (ref 0.61–1.24)
GFR, Estimated: >60 mL/min (ref >60)
Glucose, Bld: 95 mg/dL (ref 70–99)
Potassium: 4 mmol/L (ref 3.5–5.1)
Sodium: 136 mmol/L (ref 135–145)

## 2022-08-08 LAB — RAPID URINE DRUG SCREEN, HOSP PERFORMED
Amphetamines: POSITIVE — AB
Barbiturates: NOT DETECTED
Benzodiazepines: NOT DETECTED
Cocaine: POSITIVE — AB
Opiates: NOT DETECTED
Tetrahydrocannabinol: POSITIVE — AB

## 2022-08-08 LAB — RESP PANEL BY RT-PCR (FLU A&B, COVID) ARPGX2
Influenza A by PCR: NEGATIVE
Influenza B by PCR: NEGATIVE
SARS Coronavirus 2 by RT PCR: NEGATIVE

## 2022-08-08 LAB — ACETAMINOPHEN LEVEL: Acetaminophen (Tylenol), Serum: <10 ug/mL — ABNORMAL LOW (ref 10–30)

## 2022-08-08 LAB — SALICYLATE LEVEL: Salicylate Lvl: <7 mg/dL — ABNORMAL LOW (ref 7.0–30.0)

## 2022-08-08 LAB — ETHANOL: Alcohol, Ethyl (B): 43 mg/dL — ABNORMAL HIGH (ref <10)

## 2022-08-08 MED ORDER — THIAMINE HCL 100 MG/ML IJ SOLN: 100.0000 mg | Freq: Every day | INTRAMUSCULAR | Status: DC

## 2022-08-08 MED ORDER — ZIPRASIDONE MESYLATE 20 MG IM SOLR: 20.0000 mg | Freq: Once | INTRAMUSCULAR | Status: AC

## 2022-08-08 MED ORDER — ZIPRASIDONE MESYLATE 20 MG IM SOLR
INTRAMUSCULAR | Status: AC
  Administered 2022-08-08: 20 mg via INTRAMUSCULAR
  Filled 2022-08-08: qty 20

## 2022-08-08 MED ORDER — ADULT MULTIVITAMIN W/MINERALS CH: 1.0000 | ORAL_TABLET | Freq: Every day | ORAL | Status: DC

## 2022-08-08 MED ORDER — STERILE WATER FOR INJECTION IJ SOLN
INTRAMUSCULAR | Status: AC
  Administered 2022-08-08: 1.2 mL via INTRAMUSCULAR
  Filled 2022-08-08: qty 10

## 2022-08-08 MED ORDER — LORATADINE 10 MG PO TABS
10.0000 mg | ORAL_TABLET | Freq: Every day | ORAL | Status: DC
  Administered 2022-08-09 – 2022-08-10 (×2): 10 mg via ORAL
  Filled 2022-08-08 (×2): qty 1

## 2022-08-08 MED ORDER — NIRMATRELVIR/RITONAVIR (PAXLOVID)TABLET: 3.0000 | ORAL_TABLET | Freq: Two times a day (BID) | ORAL | Status: DC

## 2022-08-08 MED ORDER — THIAMINE MONONITRATE 100 MG PO TABS
100.0000 mg | ORAL_TABLET | Freq: Every day | ORAL | Status: DC
  Administered 2022-08-08 – 2022-08-10 (×3): 100 mg via ORAL
  Filled 2022-08-08 (×3): qty 1

## 2022-08-08 MED ORDER — LORAZEPAM 1 MG PO TABS: 1.0000 mg | ORAL_TABLET | ORAL | Status: DC | PRN

## 2022-08-08 MED ORDER — PANTOPRAZOLE SODIUM 40 MG PO TBEC
40.0000 mg | DELAYED_RELEASE_TABLET | Freq: Every day | ORAL | Status: DC
  Administered 2022-08-09 – 2022-08-10 (×2): 40 mg via ORAL
  Filled 2022-08-08 (×2): qty 1

## 2022-08-08 MED ORDER — LACTATED RINGERS IV BOLUS
1000.0000 mL | Freq: Once | INTRAVENOUS | Status: AC
  Administered 2022-08-08: 1000 mL via INTRAVENOUS

## 2022-08-08 MED ORDER — LORAZEPAM 2 MG/ML IJ SOLN: 1.0000 mg | INTRAMUSCULAR | Status: DC | PRN

## 2022-08-08 MED ORDER — LORAZEPAM 2 MG/ML IJ SOLN: 0.0000 mg | Freq: Two times a day (BID) | INTRAMUSCULAR | Status: DC

## 2022-08-08 MED ORDER — LORAZEPAM 2 MG/ML IJ SOLN: 0.0000 mg | Freq: Four times a day (QID) | INTRAMUSCULAR | Status: DC

## 2022-08-08 MED ORDER — OLANZAPINE 10 MG PO TBDP: 10.0000 mg | ORAL_TABLET | Freq: Every day | ORAL | Status: DC

## 2022-08-08 MED ORDER — SODIUM CHLORIDE 0.9 % IV BOLUS: 1000.0000 mL | Freq: Once | INTRAVENOUS | Status: DC

## 2022-08-08 MED ORDER — LORAZEPAM 1 MG PO TABS
0.0000 mg | ORAL_TABLET | Freq: Four times a day (QID) | ORAL | Status: DC
  Administered 2022-08-08 – 2022-08-09 (×2): 2 mg via ORAL
  Filled 2022-08-08 (×2): qty 2

## 2022-08-08 MED ORDER — ARIPIPRAZOLE 10 MG PO TABS
10.0000 mg | ORAL_TABLET | Freq: Every day | ORAL | Status: DC
  Administered 2022-08-09 – 2022-08-10 (×2): 10 mg via ORAL
  Filled 2022-08-08 (×2): qty 1

## 2022-08-08 MED ORDER — FOLIC ACID 1 MG PO TABS: 1.0000 mg | ORAL_TABLET | Freq: Every day | ORAL | Status: DC

## 2022-08-08 MED ORDER — LORAZEPAM 1 MG PO TABS: 0.0000 mg | ORAL_TABLET | Freq: Two times a day (BID) | ORAL | Status: DC

## 2022-08-08 MED ORDER — THIAMINE MONONITRATE 100 MG PO TABS: 100.0000 mg | ORAL_TABLET | Freq: Every day | ORAL | Status: DC
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
## 2022-08-08 NOTE — ED Provider Notes (Signed)
  Physical Exam  BP 124/76 (BP Location: Right Arm)   Pulse 82   Temp 98.3 F (36.8 C) (Oral)   Resp 16   Ht 5\' 7"  (1.702 m)   Wt 69.9 kg   SpO2 100%   BMI 24.12 kg/m   Physical Exam Vitals and nursing note reviewed.  Constitutional:      General: He is not in acute distress. HENT:     Head: Normocephalic and atraumatic.  Pulmonary:     Effort: No respiratory distress.  Abdominal:     Palpations: Abdomen is soft.  Musculoskeletal:     Cervical back: Neck supple.  Skin:    General: Skin is warm and dry.  Neurological:     Mental Status: He is alert.  Psychiatric:        Thought Content: Thought content is paranoid. Thought content includes homicidal ideation. Thought content does not include suicidal ideation. Thought content does not include homicidal or suicidal plan.    Procedures  Procedures  ED Course / MDM    Medical Decision Making Amount and/or Complexity of Data Reviewed Labs: ordered.  Risk Prescription drug management.   Patient signed out to me at shift change from Lavonna Rua PA-C.  Please see previous provider note for further details.  In short, this is a 37 yom with hx of psychotic affective disorder and alcohol use disorder presenting today with hallucinations.  Received geodon upon arrival.  Given fluid bolus.  UDS positive for cocaine, THC, and amphetamines.  [name] of 43 at 0436.  Appears to have improved alertness with fluid bolus.  Reassessed and found to be back at baseline alertness though increasing apparent paranoia.  Thinks the [employer] is watching him and that helicopters are flying overhead.  Also expresses ideation of harming family relative.  Repeat BMP reassuring, consult TTS pending.  Likely meets IVC criteria, though here voluntarily at this time.  Anticipate admission to inpatient psychiatry.    Workup pertinent findings: --CBG 94 --Alcohol level 43 --UDS positive for cocaine, amphetamines, and THC --EKG sinus tachycardia with  nonspecific repolarization, however heart rate has remained in the ED is in 70s since arrival. --Creatinine 1.31 with BUN 24 and anion gap 17, mildly elevated above baseline.  Upon reevaluation with the med, anion gap has resolved and creatinine has come down to 1.07.  Medications: --Received Geodon and LR since arrival  Consultations --TTS pending  Plan --Patient medically cleared for psychiatric evaluation, disposition pending psychiatry.  This chart was dictated using voice recognition software.  Despite best efforts to proofread, errors can occur which can change the documentation meaning.     Prince Rome, PA-C 17/00/17 1717    Carmin Muskrat, MD 08/10/22 [phone]

## 2022-08-08 NOTE — ED Notes (Signed)
Pt not answering triage questions, pacing in triage and paranoid

## 2022-08-08 NOTE — ED Notes (Signed)
Pt laying on floor in front of nurses station. Unable to redirect pt back to room at this time.

## 2022-08-08 NOTE — ED Notes (Signed)
Pt given a sandwich and drink.

## 2022-08-08 NOTE — ED Notes (Signed)
Pt laid down on the floor and would not get up, pt snatched away and grabbed staff by the hand. pt had to be escorted by security to his room.

## 2022-08-08 NOTE — ED Provider Notes (Signed)
[facility]-EMERGENCY DEPT Provider Note   CSN: [id number] Arrival date & time: 08/08/22  0331     History  Chief Complaint  Patient presents with   Hallucinations    William Mayer is a 31 y.o. male with a hx of etoh use, psychotic affective disorder, and seizures who presents to the ED for evaluation of AMS.   Per police/EMS called out as patient was running into  a hotel yelling someone was chasing him with a knife and seemed very paranoid and to be hallucinating. On first responder arrival patient yelling about someone having a knife, responding to internal stimuli, and very uncooperative. CBG 66 w/ EMS.   Patient not answering questions, uncooperative, level 5 caveat applies secondary to AMS/psychiatric condition.   HPI     Home Medications Prior to Admission medications   Medication Sig Start Date End Date Taking? Authorizing Provider  ARIPiprazole (ABILIFY) 10 MG tablet Take 1 tablet (10 mg total) by mouth daily. 06/29/21   Storm Frisk, MD  cetirizine (ZYRTEC) 10 MG tablet Take 1 tablet (10 mg total) by mouth daily. 06/29/21   Storm Frisk, MD  fluticasone (FLONASE) 50 MCG/ACT nasal spray Place 1 spray into both nostrils daily. 06/29/21   Storm Frisk, MD  fluticasone (FLONASE) 50 MCG/ACT nasal spray Place 1 spray into both nostrils daily as needed for allergies or rhinitis.    [provider]  ibuprofen (ADVIL) 600 MG tablet Take 1 tablet (600 mg total) by mouth every 6 (six) hours as needed for moderate pain. 01/23/21   Storm Frisk, MD  ibuprofen (ADVIL) 800 MG tablet Take 1 tablet (800 mg total) by mouth every 8 (eight) hours as needed for moderate pain. 02/11/22   Gwyneth Sprout, MD  loratadine (CLARITIN) 10 MG tablet Take 10 mg by mouth daily as needed for allergies.    [provider]  nirmatrelvir/ritonavir EUA (PAXLOVID) 20 x 150 MG & 10 x 100MG  TABS Take 3 tablets by mouth 2 (two) times daily for 5 days. (Take  nirmatrelvir 150 mg two tablets twice daily for 5 days and ritonavir 100 mg one tablet twice daily for 5 days) Patient GFR is >60 08/03/22 08/08/22  10/08/22, MD  pantoprazole (PROTONIX) 40 MG tablet Take 1 tablet (40 mg total) by mouth daily. 06/29/21   08/29/21, MD  OLANZapine (ZYPREXA) 20 MG tablet Take 1 tablet (20 mg total) by mouth daily. Schedule an OV for additional RF's Patient not taking: Reported on 08/05/2019 09/24/18 08/05/19  10/05/19, MD      Allergies    Amoxicillin and Amoxil [amoxicillin]    Review of Systems   Review of Systems  Unable to perform ROS: Mental status change    Physical Exam Updated Vital Signs BP (!) 150/95   Pulse (!) 116   Temp 98.3 F (36.8 C) (Oral)   Resp 17   Ht 5\' 7"  (1.702 m)   Wt 69.9 kg   SpO2 100%   BMI 24.12 kg/m  Physical Exam Vitals and nursing note reviewed.  HENT:     Head: Normocephalic and atraumatic.  Eyes:     Comments: Pupils dilated, equal & reactive   Cardiovascular:     Rate and Rhythm: Tachycardia present.  Pulmonary:     Effort: Pulmonary effort is normal.     Breath sounds: Normal breath sounds.  Abdominal:     General: There is no distension.     Palpations:  Abdomen is soft.     Tenderness: There is no abdominal tenderness.  Musculoskeletal:     Cervical back: Neck supple. No rigidity.  Neurological:     Mental Status: He is alert.     Comments: Moving all extremities. Ambulatory at times.   Psychiatric:        Behavior: Behavior is agitated, aggressive and combative.        Thought Content: Thought content is paranoid.     Comments: Appears to be responding to internal stimuli.      ED Results / Procedures / Treatments   Labs (all labs ordered are listed, but only abnormal results are displayed) Labs Reviewed  COMPREHENSIVE METABOLIC PANEL  [name]  CBC  SALICYLATE LEVEL  ACETAMINOPHEN LEVEL  RAPID URINE DRUG SCREEN, HOSP PERFORMED  CBG MONITORING, ED     EKG None  Radiology No results found.  Procedures Procedures    Medications Ordered in ED Medications  sterile water (preservative free) injection (1.2 mLs Intramuscular Given 08/08/22 0414)  ziprasidone (GEODON) injection 20 mg (20 mg Intramuscular Given 08/08/22 0414)    ED Course/ Medical Decision Making/ A&P                           Medical Decision Making Amount and/or Complexity of Data Reviewed Labs: ordered.  Risk Prescription drug management.  Patient presents to the ED for evaluation of AMS, currently agitated, paranoid, and appears to responding to internal stimuli. Nontoxic, tachycardic, hypertensive. Moving all extremities. No exam findings of head trauma. Given patient getting aggressive with staff geodon ordered & administered.   Additional hx of [employer] & external records, prior ED visits for similar, labs & CT head reviewed from prior visits recently.   I ordered, viewed & interpreted labs including:  CBC: unremarkable.  CMP: mild elevation in creatinine & anion gap w/ bicarb of 15- very similar labs during ED visit 04/21/22- resolved w/ IVFs @ that time.  Acetaminophen/salicylate: WNL [name]: mildly elevated UDS: + for cocaine, amphetamines, and tetrahydrocannabinol.   Patient sleeping S/p geodon.   06:30: Patient care signed out to PA Smoot @ shift change pending patient metabolization & disposition.    Final Clinical Impression(s) / ED Diagnoses Final diagnoses:  None    Rx / DC Orders ED Discharge Orders     None         Amaryllis Dyke, PA-C 08/08/22 0645    Merryl Hacker, MD 08/08/22 [phone]

## 2022-08-08 NOTE — ED Notes (Signed)
Pt refuse EKG

## 2022-08-08 NOTE — ED Triage Notes (Signed)
Pt BIB [facility] after running into a hotel yelling someone was chasing him with a knife, pt talking to people who are not there and having visual hallucinations, extremely paranoid; per EMS pt uncooperative en route; CBG 66, v/s 115HR, 98% on RA, 150/72 en route

## 2022-08-09 NOTE — Progress Notes (Signed)
William Mayer is a 31 y.o. male with a hx of etoh use, psychotic affective disorder, and seizures who presents to the ED for evaluation of AMS.    Per police/EMS called out as patient was running into  a hotel yelling someone was chasing him with a knife and seemed very paranoid and to be hallucinating. On first responder arrival patient yelling about someone having a knife, responding to internal stimuli, and very uncooperative.   Attempted to assess patient multiple times throughout the day. Patient is drowsy, easily aroused, but immediately falls back to sleep. Unable to fully participate in assessment. He does acknowledge recent substance abuse.

## 2022-08-09 NOTE — ED Provider Notes (Signed)
Emergency Medicine Observation Re-evaluation Note  William Mayer is a 31 y.o. male, seen on rounds today.  Pt initially presented to the ED for complaints of Hallucinations Currently, the patient is HDS in NAD.  Physical Exam  BP 119/76 (BP Location: Right Arm)   Pulse 84   Temp 98.3 F (36.8 C) (Oral)   Resp 17   Ht 5\' 7"  (1.702 m)   Wt 69.9 kg   SpO2 98%   BMI 24.12 kg/m  Physical Exam General: Appears to be resting comfortably in bed, no acute distress. Cardiac: Regular rate, normal heart rate, non-emergent blood pressure for this morning's vitals. Lungs: No increased work of breathing.  Equal chest rise appreciated Psych: Calm, asleep in bed.   ED Course / MDM  EKG:EKG Interpretation  Date/Time:  Wednesday August 08 2022 04:36:16 EDT Ventricular Rate:  101 PR Interval:  159 QRS Duration: 81 QT Interval:  339 QTC Calculation: 440 R Axis:   74 Text Interpretation: Sinus tachycardia Right atrial enlargement ST elev, probable normal early repol pattern Confirmed by Thayer Jew [phone]) on 08/08/2022 5:56:05 AM  I have reviewed the labs performed to date as well as medications administered while in observation.    Plan  Current plan is for psychiatric consultation.    Tretha Sciara, MD 08/09/22 [phone]

## 2022-08-09 NOTE — BH Assessment (Signed)
TTS clinician attempted to complete TTS assessment. Minna Merritts, RN, patient medicated and asleep. TTS will attempt at later time.

## 2022-08-09 NOTE — Progress Notes (Signed)
Transition of Care [facility] - Emergency Department Mini Assessment   Patient Details  Name: William Mayer MRN: [id number] Date of Birth: 12-17-1990  Transition of Care [facility]) CM/SW Contact:    Kimber Relic, LCSW Phone Number: 08/09/2022, 8:01 AM   Clinical Narrative: Pt brought in by [facility] experiencing AVH. Pt is currently waiting to be seen by TTS. TOC was consulted for SA resources. However, it appears someone has already attached SA resources. There are no other TOC needs at this time. TOC signing off.    ED Mini Assessment: What brought you to the Emergency Department? : AVH  Barriers to Discharge: No Barriers Identified     Means of departure: Not know       Patient Contact and Communications        ,                 Admission diagnosis:  Psych issues Patient Active Problem List   Diagnosis Date Noted   COVID-19 virus infection 08/03/2022   Diabetes mellitus screening 06/29/2021   Allergic rhinitis 01/23/2021   GERD (gastroesophageal reflux disease) 01/23/2021   Alcohol use disorder, severe, dependence ([place]) 01/23/2021   Chronic bilateral low back pain without sciatica 06/17/2017   Psychotic affective disorder ([place]) 03/24/2017   PCP:  Elsie Stain, MD Pharmacy:   [facility] [address], [place] [address] Phone: [phone] Fax: [place] [place] [employer] [place] [address] Phone: [phone] Fax: [phone]

## 2022-08-09 NOTE — ED Notes (Signed)
The pt is lying in bed talking to an imaginary person about being accused of showing this person how to "release himself" in a store earlier yesterday. He states this person is telling lies about him, and he would like to speak with a police officer to tell his side of the story. Advised the pt that he will be speaking with a counselor later on today, and he could explain everything at that time.

## 2022-08-10 DIAGNOSIS — F1994 Other psychoactive substance use, unspecified with psychoactive substance-induced mood disorder: Secondary | ICD-10-CM | POA: Diagnosis present

## 2022-08-10 NOTE — BH Assessment (Signed)
Clinician received a messaged from Claris Pong, RN, who expressed the pt is awake and ready to engage. Clinician expressed the pt will be assessed during day shift.

## 2022-08-10 NOTE — BH Assessment (Signed)
Clinician spoke to Claris Pong, RN who expressed the pt is sleeping and unable to engage in TTS assessment. Clinician asked RN to contact her when the pt wakes.    Vertell Novak, [place], [facility], [facility] Triage Specialist [phone]

## 2022-08-10 NOTE — ED Notes (Signed)
Pt requesting to leave. Corene Cornea, NP made aware. Per NP, pt voluntary and may leave. Coming to see if he can wait.

## 2022-08-10 NOTE — ED Notes (Signed)
Pt refuses to wait on AVS. Pt ambulated out of department with steady gait. Pt refuses discharge VS.

## 2022-08-10 NOTE — Discharge Summary (Signed)
[facility] Psych ED Discharge  08/10/2022 10:39 AM William Mayer  MRN:  [id number]  Principal Problem: Substance induced mood disorder [facility]) Discharge Diagnoses: Principal Problem:   Substance induced mood disorder ([place])  Clinical Impression:  Final diagnoses:  Hallucinations  Paranoia ([place])  Substance induced mood disorder ([place])   Subjective: "Can I get my clothes so I can leave."  ED Assessment Time Calculation: Start Time: 1015 Stop Time: 1030 Total Time in Minutes (Assessment Completion): 15  On evaluation patient is alert and oriented x 4. Speech is clear and coherent. Mood is euthymic  and affect is congruent with mood. Thought process is coherent and thought content is logical. Denies auditory and visual hallucinations. No indication that patient is responding to internal stimuli. No evidence of delusional thought content. Denies suicidal ideations. Denies homicidal ideations. Denies substance abuse.     It is my opinion that William Mayer is currently a low risk of imminent danger to self or others as a result of endogenous modifiable mental illness exacerbation. He does not meet criteria for IVC.   Past Psychiatric History: polysubstance abuse  Past Medical History:  Past Medical History:  Diagnosis Date   Allergic rhinitis    Seizure ([place]) 01/23/2021    Past Surgical History:  Procedure Laterality Date   NO PAST SURGERIES     Family History:  Family History  Problem Relation Age of Onset   Gout Father    Arthritis Father     Social History:  Social History   Substance and Sexual Activity  Alcohol Use Yes   Comment: Daily. Last drink: last night      Social History   Substance and Sexual Activity  Drug Use Yes   Types: Marijuana   Comment: Last used: yesterday     Social History   Socioeconomic History   Marital status: Single    Spouse name: Not on file   Number of children: Not on file   Years of education: Not on file   Highest education level: Not on  file  Occupational History   Not on file  Tobacco Use   Smoking status: Light Smoker    Packs/day: 0.25    Types: Cigarettes   Smokeless tobacco: Never   Tobacco comments:    1 or 2  Vaping Use   Vaping Use: Never used  Substance and Sexual Activity   Alcohol use: Yes    Comment: Daily. Last drink: last night    Drug use: Yes    Types: Marijuana    Comment: Last used: yesterday    Sexual activity: Yes  Other Topics Concern   Not on file  Social History Narrative   Not on file   Social Determinants of Health   Financial Resource Strain: Not on file  Food Insecurity: Not on file  Transportation Needs: Not on file  Physical Activity: Not on file  Stress: Not on file  Social Connections: Not on file    Tobacco Cessation:  A prescription for an FDA-approved tobacco cessation medication was offered at discharge and the patient refused  Current Medications: Current Facility-Administered Medications  Medication Dose Route Frequency Provider Last Rate Last Admin   ARIPiprazole (ABILIFY) tablet 10 mg  10 mg Oral Daily Dorise Bullion M, PA-C   10 mg at 08/10/22 0942   loratadine (CLARITIN) tablet 10 mg  10 mg Oral Daily Dorise Bullion M, PA-C   10 mg at 08/10/22 0943   LORazepam (ATIVAN) injection 0-4 mg  0-4 mg  Intravenous Y0V Dorise Bullion M, PA-C       Or   LORazepam (ATIVAN) tablet 0-4 mg  0-4 mg Oral P7T Dorise Bullion M, PA-C   2 mg at 08/09/22 0721   [START ON 08/11/2022] LORazepam (ATIVAN) injection 0-4 mg  0-4 mg Intravenous G62I Dorise Bullion M, PA-C       Or   [START ON 08/11/2022] LORazepam (ATIVAN) tablet 0-4 mg  0-4 mg Oral R48N Dorise Bullion M, PA-C       pantoprazole (PROTONIX) EC tablet 40 mg  40 mg Oral Daily Dorise Bullion M, [place]   40 mg at 08/10/22 4627   thiamine (VITAMIN B1) tablet 100 mg  100 mg Oral Daily Dorise Bullion M, PA-C   035 mg at 08/10/22 0093   Or   thiamine (VITAMIN B1) injection 100 mg  100 mg Intravenous Daily  Prince Rome, PA-C       Current Outpatient Medications  Medication Sig Dispense Refill   cetirizine (ZYRTEC) 10 MG tablet Take 1 tablet (10 mg total) by mouth daily. (Patient taking differently: Take 10 mg by mouth daily as needed for allergies or rhinitis.) 30 tablet 11   fluticasone (FLONASE) 50 MCG/ACT nasal spray Place 1 spray into both nostrils daily. (Patient taking differently: Place 1-2 sprays into both nostrils daily as needed for allergies or rhinitis.) 16 g 11   ARIPiprazole (ABILIFY) 10 MG tablet Take 1 tablet (10 mg total) by mouth daily. (Patient not taking: Reported on 08/08/2022) 40 tablet 2   ibuprofen (ADVIL) 600 MG tablet Take 1 tablet (600 mg total) by mouth every 6 (six) hours as needed for moderate pain. (Patient not taking: Reported on 08/08/2022) 60 tablet 1   ibuprofen (ADVIL) 800 MG tablet Take 1 tablet (800 mg total) by mouth every 8 (eight) hours as needed for moderate pain. (Patient not taking: Reported on 08/08/2022) 15 tablet 0   pantoprazole (PROTONIX) 40 MG tablet Take 1 tablet (40 mg total) by mouth daily. (Patient not taking: Reported on 08/08/2022) 30 tablet 3   PTA Medications: (Not in a hospital admission)   [place] ED from 07/06/2022 in [place] DEPT ED from 04/21/2022 in [place] ED from 01/23/2022 in [place] DEPT  C-SSRS RISK CATEGORY No Risk No Risk No Risk       Musculoskeletal: Strength & Muscle Tone: within normal limits Gait & Station: normal Patient leans: N/A  Psychiatric Specialty Exam: Presentation  General Appearance:  Casual  Eye Contact: Good  Speech: Clear and Coherent; Normal Rate  Speech Volume: Normal  Handedness:No data recorded  Mood and Affect  Mood: Euthymic  Affect: Congruent   Thought Process  Thought Processes: Coherent; Linear  Descriptions of  Associations:Intact  Orientation:Full (Time, Place and Person)  Thought Content:Logical  History of Schizophrenia/Schizoaffective disorder:No  Duration of Psychotic Symptoms:No data recorded Hallucinations:Hallucinations: None  Ideas of Reference:None  Suicidal Thoughts:Suicidal Thoughts: No  Homicidal Thoughts:Homicidal Thoughts: No   Sensorium  Memory: Immediate Good  Judgment: Fair  Insight: Present   Executive Functions  Concentration: Fair  Attention Span: Fair  Recall: [place] of Knowledge: Good  Language: Good   Psychomotor Activity  Psychomotor Activity: Psychomotor Activity: Normal   Assets  Assets: Desire for Improvement   Sleep  Sleep: Sleep: Good    Physical Exam: Physical Exam Constitutional:      General: He is not in acute distress.  Appearance: He is not ill-appearing, toxic-appearing or diaphoretic.  Eyes:     General:        Right eye: No discharge.        Left eye: No discharge.  Cardiovascular:     Rate and Rhythm: Normal rate.  Pulmonary:     Effort: Pulmonary effort is normal.  Musculoskeletal:        General: Normal range of motion.     Cervical back: Normal range of motion.  Neurological:     Mental Status: He is alert and oriented to person, place, and time.  Psychiatric:        Speech: Speech normal.        Thought Content: Thought content is not paranoid or delusional. Thought content does not include homicidal or suicidal ideation.    Review of Systems  Respiratory:  Negative for cough and shortness of breath.   Cardiovascular:  Negative for chest pain.  Gastrointestinal:  Negative for diarrhea, nausea and vomiting.  Psychiatric/Behavioral:  Positive for substance abuse. Negative for depression, hallucinations, memory loss and suicidal ideas. The patient is not nervous/anxious and does not have insomnia.    Blood pressure 130/85, pulse 66, temperature (!) 97.4 F (36.3 C), temperature source  Oral, resp. rate 18, height 5\' 7"  (1.702 m), weight 69.9 kg, SpO2 100 %. Body mass index is 24.12 kg/m.   Demographic Factors:  Male and Low socioeconomic status  Loss Factors: Financial problems/change in socioeconomic status  Historical Factors: Family history of mental illness or substance abuse  Risk Reduction Factors:   Sense of responsibility to family and Living with another person, especially a relative  Continued Clinical Symptoms:  Alcohol/Substance Abuse/Dependencies Previous Psychiatric Diagnoses and Treatments  Cognitive Features That Contribute To Risk:  None    Suicide Risk:  Minimal: No identifiable suicidal ideation.  Patients presenting with no risk factors but with morbid ruminations; may be classified as minimal risk based on the severity of the depressive symptoms   Follow-up Information     [facility]   Specialty: Urgent Care Contact information: [address] [place] [id number] [phone]               Medical Decision Making: At time of discharge, patient denies SI, HI, AVH and is able to contract for safety. He demonstrated no overt evidence of psychosis or mania. Prior to discharge, Fayrene FearingJames verbalized that he understood warning signs, triggers, and symptoms of worsening mental health and how to access emergency mental health care if they felt it was needed. Patient was instructed to call 911 or return to the emergency room if they experienced any concerning symptoms after discharge. Patient voiced understanding and agreed to this.    Disposition: No evidence of imminent risk to self or others at present.   Patient does not meet criteria for psychiatric inpatient admission. Discussed crisis plan, support from social network, calling 911, coming to the Emergency Department, and calling Suicide Hotline.      Discharge Instructions       Discharge recommendations:  Patient is to take  medications as prescribed. Please see information for follow-up appointment with psychiatry and therapy. Please follow up with your primary care provider for all medical related needs.   Therapy: We recommend that patient participate in individual therapy to address mental health concerns.  Medications: The patient is to contact a medical professional and/or outpatient provider to address any new side effects that develop. Patient should  update outpatient providers of any new medications and/or medication changes.   Atypical antipsychotics: If you are prescribed an atypical antipsychotic, it is recommended that your height, weight, BMI, blood pressure, fasting lipid panel, and fasting blood sugar be monitored by your outpatient providers.  Safety:  The patient should abstain from use of illicit substances/drugs and abuse of any medications. If symptoms worsen or do not continue to improve or if the patient becomes actively suicidal or homicidal then it is recommended that the patient return to the closest hospital emergency department, the [facility], or call 911 for further evaluation and treatment. National Suicide Prevention Lifeline 1-800-SUICIDE or (847)474-8376.  About 988 988 offers 24/7 access to trained crisis counselors who can help people experiencing mental health-related distress. People can call or text 988 or chat 988lifeline.org for themselves or if they are worried about a loved one who may need crisis support.  Crisis Mobile: [facility]:                     618-162-0097 (for crisis response 24 hours a day) [place]:                                            [phone]       Rozetta Nunnery, NP 08/10/2022, 10:39 AM

## 2022-08-10 NOTE — Discharge Instructions (Signed)
  Discharge recommendations:  Patient is to take medications as prescribed. Please see information for follow-up appointment with psychiatry and therapy. Please follow up with your primary care provider for all medical related needs.   Therapy: We recommend that patient participate in individual therapy to address mental health concerns.  Medications: The patient is to contact a medical professional and/or outpatient provider to address any new side effects that develop. Patient should update outpatient providers of any new medications and/or medication changes.   Atypical antipsychotics: If you are prescribed an atypical antipsychotic, it is recommended that your height, weight, BMI, blood pressure, fasting lipid panel, and fasting blood sugar be monitored by your outpatient providers.  Safety:  The patient should abstain from use of illicit substances/drugs and abuse of any medications. If symptoms worsen or do not continue to improve or if the patient becomes actively suicidal or homicidal then it is recommended that the patient return to the closest hospital emergency department, the [facility], or call 911 for further evaluation and treatment. National Suicide Prevention Lifeline 1-800-SUICIDE or 1-800-273-8255.  About 988 988 offers 24/7 access to trained crisis counselors who can help people experiencing mental health-related distress. People can call or text 988 or chat 988lifeline.org for themselves or if they are worried about a loved one who may need crisis support.  Crisis Mobile: [facility]:                     1-877-626-1772 (for crisis response 24 hours a day) [place] Hotline:                                            1-800-256-2452  

## 2022-08-10 NOTE — ED Notes (Signed)
Patient sleeping at this time. Will get VSs when he wakes up.  

## 2022-10-04 ENCOUNTER — Encounter: Payer: Self-pay | Admitting: Critical Care Medicine

## 2022-10-04 NOTE — Progress Notes (Deleted)
   Established Patient Office Visit  Subjective   Patient ID: William Mayer, male    DOB: 1991-02-02  Age: 31 y.o. MRN: [id number]  No chief complaint on file.   HPI  {History (Optional):23778}  ROS    Objective:     There were no vitals taken for this visit. {Vitals History (Optional):23777}  Physical Exam   No results found for any visits on 10/04/22.  {Labs (Optional):23779}  The ASCVD Risk score (Arnett DK, et al., 2019) failed to calculate for the following reasons:   The 2019 ASCVD risk score is only valid for ages 67 to 27    Assessment & Plan:   Problem List Items Addressed This Visit   None   No follow-ups on file.    Shan Levans, MD

## 2022-11-05 ENCOUNTER — Other Ambulatory Visit: Payer: Self-pay

## 2022-11-05 ENCOUNTER — Encounter (HOSPITAL_COMMUNITY): Payer: Self-pay

## 2022-11-05 ENCOUNTER — Emergency Department (HOSPITAL_COMMUNITY)
Admission: EM | Admit: 2022-11-05 | Discharge: 2022-11-05 | Disposition: A | Discharge status: Home / Self Care | Payer: Medicaid Other | Attending: Emergency Medicine | Admitting: Emergency Medicine

## 2022-11-05 DIAGNOSIS — T405X1A Poisoning by cocaine, accidental (unintentional), initial encounter: Secondary | ICD-10-CM | POA: Insufficient documentation

## 2022-11-05 DIAGNOSIS — Y900 Blood alcohol level of less than 20 mg/100 ml: Secondary | ICD-10-CM | POA: Diagnosis not present

## 2022-11-05 DIAGNOSIS — T424X1A Poisoning by benzodiazepines, accidental (unintentional), initial encounter: Secondary | ICD-10-CM | POA: Diagnosis not present

## 2022-11-05 DIAGNOSIS — T40711A Poisoning by cannabis, accidental (unintentional), initial encounter: Secondary | ICD-10-CM | POA: Insufficient documentation

## 2022-11-05 DIAGNOSIS — T50901A Poisoning by unspecified drugs, medicaments and biological substances, accidental (unintentional), initial encounter: Secondary | ICD-10-CM

## 2022-11-05 LAB — COMPREHENSIVE METABOLIC PANEL
ALT: 22 U/L (ref 0–44)
AST: 29 U/L (ref 15–41)
Albumin: 3.8 g/dL (ref 3.5–5.0)
Alkaline Phosphatase: 87 U/L (ref 38–126)
Anion gap: 7 (ref 5–15)
BUN: 14 mg/dL (ref 6–20)
CO2: 26 mmol/L (ref 22–32)
Calcium: 9.1 mg/dL (ref 8.9–10.3)
Chloride: 107 mmol/L (ref 98–111)
Creatinine, Ser: 0.92 mg/dL (ref 0.61–1.24)
GFR, Estimated: >60 mL/min (ref >60)
Glucose, Bld: 120 mg/dL — ABNORMAL HIGH (ref 70–99)
Potassium: 4 mmol/L (ref 3.5–5.1)
Sodium: 140 mmol/L (ref 135–145)
Total Bilirubin: 0.3 mg/dL (ref 0.3–1.2)
Total Protein: 7.1 g/dL (ref 6.5–8.1)

## 2022-11-05 LAB — SALICYLATE LEVEL: Salicylate Lvl: <7 mg/dL — ABNORMAL LOW (ref 7.0–30.0)

## 2022-11-05 LAB — RAPID URINE DRUG SCREEN, HOSP PERFORMED
Amphetamines: POSITIVE — AB
Barbiturates: NOT DETECTED
Benzodiazepines: NOT DETECTED
Cocaine: POSITIVE — AB
Opiates: NOT DETECTED
Tetrahydrocannabinol: POSITIVE — AB

## 2022-11-05 LAB — CBC WITH DIFFERENTIAL/PLATELET
Abs Immature Granulocytes: 0.02 10*3/uL (ref 0.00–0.07)
Basophils Absolute: 0 10*3/uL (ref 0.0–0.1)
Basophils Relative: 0 %
Eosinophils Absolute: 0.1 10*3/uL (ref 0.0–0.5)
Eosinophils Relative: 1 %
HCT: 41.9 % (ref 39.0–52.0)
Hemoglobin: 13.4 g/dL (ref 13.0–17.0)
Immature Granulocytes: 0 %
Lymphocytes Relative: 35 %
Lymphs Abs: 1.8 10*3/uL (ref 0.7–4.0)
MCH: 31.7 pg (ref 26.0–34.0)
MCHC: 32 g/dL (ref 30.0–36.0)
MCV: 99.1 fL (ref 80.0–100.0)
Monocytes Absolute: 0.5 10*3/uL (ref 0.1–1.0)
Monocytes Relative: 9 %
Neutro Abs: 2.9 10*3/uL (ref 1.7–7.7)
Neutrophils Relative %: 55 %
Platelets: 330 10*3/uL (ref 150–400)
RBC: 4.23 MIL/uL (ref 4.22–5.81)
RDW: 12.2 % (ref 11.5–15.5)
WBC: 5.3 10*3/uL (ref 4.0–10.5)
nRBC: 0 % (ref 0.0–0.2)

## 2022-11-05 LAB — URINALYSIS, ROUTINE W REFLEX MICROSCOPIC
Bilirubin Urine: NEGATIVE
Glucose, UA: NEGATIVE mg/dL
Hgb urine dipstick: NEGATIVE
Ketones, ur: NEGATIVE mg/dL
Leukocytes,Ua: NEGATIVE
Nitrite: NEGATIVE
Protein, ur: NEGATIVE mg/dL
Specific Gravity, Urine: 1.02 (ref 1.005–1.030)
pH: 6 (ref 5.0–8.0)

## 2022-11-05 LAB — ACETAMINOPHEN LEVEL: Acetaminophen (Tylenol), Serum: <10 ug/mL — ABNORMAL LOW (ref 10–30)

## 2022-11-05 LAB — ETHANOL: Alcohol, Ethyl (B): <10 mg/dL (ref <10)

## 2022-11-05 NOTE — Discharge Instructions (Signed)
Abstain from illegal drug use. Follow up with PCP as needed. Return if worse

## 2022-11-05 NOTE — ED Triage Notes (Signed)
Per report pt was found in the bathroom at [facility] Unknown substance Ingested. EMS report No drug paraphernalia at the scene.  Hx of drug abuse, Schizophrenia, depression.  BP 146/82 HR 82 RR 18 O2sat 98% on RA CBG 123

## 2022-11-05 NOTE — ED Provider Notes (Signed)
[facility] DEPT Provider Note   CSN: [id number] Arrival date & time: 11/05/22  1558     History  Chief Complaint  Patient presents with   Drug Overdose    William Mayer is a 32 y.o. male.   Drug Overdose   Patient with medical history of schizophrenia, alcohol use disorder, polysubstance use disorder presents to the emergency department after possible ingestion.  Patient was found outside of a fast food restaurant, EMS states patient was alert on scene and no Narcan was administered.  He was drowsy and incoherent state brought to the ED for additional evaluation.  Patient is limited in his ability to contribute to the history, he is unwilling to answer questions regarding what he took prior to arrival.  Level 5 caveat applies.  He denies any intentional SI or HI.  Home Medications Prior to Admission medications   Medication Sig Start Date End Date Taking? Authorizing Provider  ARIPiprazole (ABILIFY) 10 MG tablet Take 1 tablet (10 mg total) by mouth daily. Patient not taking: Reported on 08/08/2022 06/29/21   Elsie Stain, MD  cetirizine (ZYRTEC) 10 MG tablet Take 1 tablet (10 mg total) by mouth daily. Patient taking differently: Take 10 mg by mouth daily as needed for allergies or rhinitis. 06/29/21   Elsie Stain, MD  fluticasone (FLONASE) 50 MCG/ACT nasal spray Place 1 spray into both nostrils daily. Patient taking differently: Place 1-2 sprays into both nostrils daily as needed for allergies or rhinitis. 06/29/21   Elsie Stain, MD  ibuprofen (ADVIL) 600 MG tablet Take 1 tablet (600 mg total) by mouth every 6 (six) hours as needed for moderate pain. Patient not taking: Reported on 08/08/2022 01/23/21   Elsie Stain, MD  ibuprofen (ADVIL) 800 MG tablet Take 1 tablet (800 mg total) by mouth every 8 (eight) hours as needed for moderate pain. Patient not taking: Reported on 08/08/2022 02/11/22   Blanchie Dessert, MD  pantoprazole  (PROTONIX) 40 MG tablet Take 1 tablet (40 mg total) by mouth daily. Patient not taking: Reported on 08/08/2022 06/29/21   Elsie Stain, MD  OLANZapine (ZYPREXA) 20 MG tablet Take 1 tablet (20 mg total) by mouth daily. Schedule an OV for additional RF's Patient not taking: Reported on 08/05/2019 09/24/18 08/05/19  Antony Blackbird, MD      Allergies    Amoxicillin    Review of Systems   Review of Systems  Physical Exam Updated Vital Signs BP (!) 166/91   Pulse 72   Temp 97.8 F (36.6 C) (Oral)   Resp 18   Ht 5\' 7"  (1.702 m)   Wt 70 kg   SpO2 97%   BMI 24.17 kg/m  Physical Exam Vitals and nursing note reviewed. Exam conducted with a chaperone present.  Constitutional:      Appearance: Normal appearance.     Comments: Disheveled but arousable  HENT:     Head: Normocephalic and atraumatic.  Eyes:     General: No scleral icterus.       Right eye: No discharge.        Left eye: No discharge.     Extraocular Movements: Extraocular movements intact.     Pupils: Pupils are equal, round, and reactive to light.  Cardiovascular:     Rate and Rhythm: Normal rate and regular rhythm.     Pulses: Normal pulses.     Heart sounds: Normal heart sounds. No murmur heard.    No friction rub. No  gallop.  Pulmonary:     Effort: Pulmonary effort is normal. No respiratory distress.     Breath sounds: Normal breath sounds.  Abdominal:     General: Abdomen is flat. Bowel sounds are normal. There is no distension.     Palpations: Abdomen is soft.     Tenderness: There is no abdominal tenderness.  Skin:    General: Skin is warm and dry.     Coloration: Skin is not jaundiced.  Neurological:     Mental Status: He is alert. Mental status is at baseline.     Coordination: Coordination normal.     ED Results / Procedures / Treatments   Labs (all labs ordered are listed, but only abnormal results are displayed) Labs Reviewed  COMPREHENSIVE METABOLIC PANEL - Abnormal; Notable for the following  components:      Result Value   Glucose, Bld 120 (*)    All other components within normal limits  RAPID URINE DRUG SCREEN, HOSP PERFORMED - Abnormal; Notable for the following components:   Cocaine POSITIVE (*)    Amphetamines POSITIVE (*)    Tetrahydrocannabinol POSITIVE (*)    All other components within normal limits  URINALYSIS, ROUTINE W REFLEX MICROSCOPIC - Abnormal; Notable for the following components:   APPearance HAZY (*)    All other components within normal limits  ACETAMINOPHEN LEVEL - Abnormal; Notable for the following components:   Acetaminophen (Tylenol), Serum <10 (*)    All other components within normal limits  SALICYLATE LEVEL - Abnormal; Notable for the following components:   Salicylate Lvl <7.0 (*)    All other components within normal limits  CBC WITH DIFFERENTIAL/PLATELET  [name]    EKG None  Radiology No results found.  Procedures Procedures    Medications Ordered in ED Medications - No data to display  ED Course/ Medical Decision Making/ A&P Clinical Course as of 11/05/22 2222  Mon Nov 05, 2022  1850 CBC with Differential No leukocytosis or anemia [HS]  1850 Acetaminophen level(!) Within normal limits [HS]  1850 Salicylate level(!) Within normal limits [HS]  1850 [name] wnl [HS]  1851 Comprehensive metabolic panel(!) No gross electrolyte derangement, AKI or transaminitis [HS]  2036 Patient is asleep, no acute distress.  Still not willing to ambulate although he is slightly more arousable. [HS]  2220 Rapid urine drug screen (hospital performed)(!) Positive for amphetamines, cocaine and THC consistent with accidental overdose [HS]    Clinical Course User Index [HS] Theron Arista, PA-C                           Medical Decision Making Amount and/or Complexity of Data Reviewed Labs: ordered. Decision-making details documented in ED Course.   Patient presents to the emergency department due to suspected accidental overdose.   Differential includes not limited to toxicity, metabolic abnormality, underlying infectious etiology.  On evaluation patient was initially very somnolent although arousable.  No participatory in history.  Workup was initiated to include laboratory workup, ethanol, salicylate, acetaminophen and UDS.  Patient reevaluated by myself multiple times resting comfortably in no acute distress.  I ordered, viewed and interpreted laboratory workup as documented ED course.  Given patient's U tox is notable for multiple positives-- cocaine, amphetamines and THC-- I think presentation is consistent with accidental overdose.  Patient is alert, awake, eating and drinking and ambulatory steady gait.  He has been observed in the ED for almost 10 hours.  Given that I think he  is appropriate for discharge at this time.  Do not think any additional workup is indicated.        Final Clinical Impression(s) / ED Diagnoses Final diagnoses:  Accidental overdose, initial encounter    Rx / DC Orders ED Discharge Orders     None         Theron Arista, PA-C 11/05/22 2222    Rolan Bucco, MD 11/05/22 2224

## 2022-11-08 ENCOUNTER — Telehealth: Payer: Self-pay

## 2022-11-08 NOTE — Telephone Encounter (Signed)
I called patient to schedule an ED follow up appointment.  His mother answered and said she would have him call me back

## 2022-11-20 NOTE — Telephone Encounter (Signed)
I tried to reach the patient again: [phone], message left with call back requested. Need to schedule a follow up appointment with Dr Joya Gaskins.

## 2022-12-04 ENCOUNTER — Telehealth: Payer: Self-pay

## 2022-12-04 NOTE — Telephone Encounter (Signed)
I tried calling patient again because I have not been able to reach him and he did not respond to a MyChart message. I spoke to his mother, Cory Munch, and she explained that she will need to take him to any appointment that is scheduled.  He does not drive and does not have transportation. She said he suffers from different mental illnesses and will barely talk to anyone. She also explained that she is taking him to DSS tomorrow because there are problems with his benefits that need to be addressed.  I scheduled him with Dr Joya Gaskins, 12/20/2022 @ 0930.  She said she knows where the clinic is and will be driving him, she just hopes he cooperates and will go with her.

## 2022-12-20 ENCOUNTER — Ambulatory Visit: Payer: Medicaid Other | Admitting: Critical Care Medicine

## 2022-12-22 ENCOUNTER — Ambulatory Visit: Payer: Medicaid Other | Admitting: Family

## 2023-01-08 ENCOUNTER — Emergency Department (HOSPITAL_COMMUNITY)
Admission: EM | Admit: 2023-01-08 | Discharge: 2023-01-10 | Disposition: A | Discharge status: Other Acute Inpatient Hospital | Payer: Medicaid Other | Attending: Emergency Medicine | Admitting: Emergency Medicine

## 2023-01-08 ENCOUNTER — Other Ambulatory Visit: Payer: Self-pay

## 2023-01-08 ENCOUNTER — Encounter (HOSPITAL_COMMUNITY): Payer: Self-pay | Admitting: Emergency Medicine

## 2023-01-08 DIAGNOSIS — F12180 Cannabis abuse with cannabis-induced anxiety disorder: Secondary | ICD-10-CM | POA: Diagnosis present

## 2023-01-08 DIAGNOSIS — F259 Schizoaffective disorder, unspecified: Secondary | ICD-10-CM | POA: Insufficient documentation

## 2023-01-08 DIAGNOSIS — F23 Brief psychotic disorder: Secondary | ICD-10-CM | POA: Diagnosis not present

## 2023-01-08 DIAGNOSIS — F1994 Other psychoactive substance use, unspecified with psychoactive substance-induced mood disorder: Secondary | ICD-10-CM | POA: Diagnosis present

## 2023-01-08 DIAGNOSIS — F17210 Nicotine dependence, cigarettes, uncomplicated: Secondary | ICD-10-CM | POA: Insufficient documentation

## 2023-01-08 DIAGNOSIS — Z1152 Encounter for screening for COVID-19: Secondary | ICD-10-CM | POA: Insufficient documentation

## 2023-01-08 LAB — COMPREHENSIVE METABOLIC PANEL
ALT: 13 U/L (ref 0–44)
AST: 20 U/L (ref 15–41)
Albumin: 4.1 g/dL (ref 3.5–5.0)
Alkaline Phosphatase: 79 U/L (ref 38–126)
Anion gap: 10 (ref 5–15)
BUN: 14 mg/dL (ref 6–20)
CO2: 23 mmol/L (ref 22–32)
Calcium: 9.2 mg/dL (ref 8.9–10.3)
Chloride: 105 mmol/L (ref 98–111)
Creatinine, Ser: 0.98 mg/dL (ref 0.61–1.24)
GFR, Estimated: >60 mL/min (ref >60)
Glucose, Bld: 83 mg/dL (ref 70–99)
Potassium: 3.5 mmol/L (ref 3.5–5.1)
Sodium: 138 mmol/L (ref 135–145)
Total Bilirubin: 0.7 mg/dL (ref 0.3–1.2)
Total Protein: 7.7 g/dL (ref 6.5–8.1)

## 2023-01-08 LAB — CBC WITH DIFFERENTIAL/PLATELET
Abs Immature Granulocytes: 0.01 10*3/uL (ref 0.00–0.07)
Basophils Absolute: 0 10*3/uL (ref 0.0–0.1)
Basophils Relative: 0 %
Eosinophils Absolute: 0 10*3/uL (ref 0.0–0.5)
Eosinophils Relative: 0 %
HCT: 43.1 % (ref 39.0–52.0)
Hemoglobin: 13.8 g/dL (ref 13.0–17.0)
Immature Granulocytes: 0 %
Lymphocytes Relative: 29 %
Lymphs Abs: 1.7 10*3/uL (ref 0.7–4.0)
MCH: 31.5 pg (ref 26.0–34.0)
MCHC: 32 g/dL (ref 30.0–36.0)
MCV: 98.4 fL (ref 80.0–100.0)
Monocytes Absolute: 0.5 10*3/uL (ref 0.1–1.0)
Monocytes Relative: 8 %
Neutro Abs: 3.7 10*3/uL (ref 1.7–7.7)
Neutrophils Relative %: 63 %
Platelets: 343 10*3/uL (ref 150–400)
RBC: 4.38 MIL/uL (ref 4.22–5.81)
RDW: 12 % (ref 11.5–15.5)
WBC: 6 10*3/uL (ref 4.0–10.5)
nRBC: 0 % (ref 0.0–0.2)

## 2023-01-08 LAB — ETHANOL: Alcohol, Ethyl (B): <10 mg/dL (ref <10)

## 2023-01-08 MED ORDER — STERILE WATER FOR INJECTION IJ SOLN
INTRAMUSCULAR | Status: AC
  Filled 2023-01-08: qty 10

## 2023-01-08 MED ORDER — ZIPRASIDONE MESYLATE 20 MG IM SOLR
10.0000 mg | Freq: Once | INTRAMUSCULAR | Status: AC
  Administered 2023-01-08: 10 mg via INTRAMUSCULAR
  Filled 2023-01-08: qty 20
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
## 2023-01-08 NOTE — ED Notes (Signed)
Pt awake- calm and cooperative at this time. Dinner tray provided.

## 2023-01-08 NOTE — Consult Note (Cosign Needed Addendum)
Patient is sleeping off Geodon at this time.  Will attempt assessment later.  Second attempt made to evaluate patient after eating dinner.  He covered self from head to toes and would not talk to provider.  He suddenly stated" I cannot talk, I don't want to talk" Will try again.  He was given Geodon earlier for agitation.

## 2023-01-08 NOTE — ED Provider Notes (Signed)
[place] EMERGENCY DEPARTMENT AT [facility] Provider Note   CSN: [id number] Arrival date & time: 01/08/23  1157     History  Chief Complaint  Patient presents with   Psychiatric Evaluation    William Mayer is a 32 y.o. male.  Pt is a 32 yo male with pmhx significant for schizophrenia.  Pt was brought here by the police with IVC papers.  He has not been eating or sleeping.  He is hallucinating.  He is rambling and cursing.  He has threatened others.  He was found by the police running across [id number].  He will not answer my questions.  He calls me a "bitch."  He thinks other people in the department are laughing at him.       Home Medications Prior to Admission medications   Medication Sig Start Date End Date Taking? Authorizing Provider  ARIPiprazole (ABILIFY) 10 MG tablet Take 1 tablet (10 mg total) by mouth daily. Patient not taking: Reported on 08/08/2022 06/29/21   Elsie Stain, MD  cetirizine (ZYRTEC) 10 MG tablet Take 1 tablet (10 mg total) by mouth daily. Patient taking differently: Take 10 mg by mouth daily as needed for allergies or rhinitis. 06/29/21   Elsie Stain, MD  fluticasone (FLONASE) 50 MCG/ACT nasal spray Place 1 spray into both nostrils daily. Patient taking differently: Place 1-2 sprays into both nostrils daily as needed for allergies or rhinitis. 06/29/21   Elsie Stain, MD  ibuprofen (ADVIL) 600 MG tablet Take 1 tablet (600 mg total) by mouth every 6 (six) hours as needed for moderate pain. Patient not taking: Reported on 08/08/2022 01/23/21   Elsie Stain, MD  ibuprofen (ADVIL) 800 MG tablet Take 1 tablet (800 mg total) by mouth every 8 (eight) hours as needed for moderate pain. Patient not taking: Reported on 08/08/2022 02/11/22   Blanchie Dessert, MD  pantoprazole (PROTONIX) 40 MG tablet Take 1 tablet (40 mg total) by mouth daily. Patient not taking: Reported on 08/08/2022 06/29/21   Elsie Stain, MD  OLANZapine (ZYPREXA) 20  MG tablet Take 1 tablet (20 mg total) by mouth daily. Schedule an OV for additional RF's Patient not taking: Reported on 08/05/2019 09/24/18 08/05/19  Antony Blackbird, MD      Allergies    Amoxicillin    Review of Systems   Review of Systems  Psychiatric/Behavioral:  Positive for agitation, behavioral problems, hallucinations, self-injury, sleep disturbance and suicidal ideas.   All other systems reviewed and are negative.   Physical Exam Updated Vital Signs BP 116/67 (BP Location: Left Arm)   Pulse 82   Temp 98 F (36.7 C)   Resp 18   SpO2 100%  Physical Exam Vitals and nursing note reviewed.  Constitutional:      Appearance: Normal appearance.  HENT:     Head: Normocephalic and atraumatic.     Right Ear: External ear normal.     Left Ear: External ear normal.     Nose: Nose normal.     Mouth/Throat:     Mouth: Mucous membranes are dry.  Eyes:     Extraocular Movements: Extraocular movements intact.     Pupils: Pupils are equal, round, and reactive to light.  Cardiovascular:     Rate and Rhythm: Normal rate and regular rhythm.     Pulses: Normal pulses.     Heart sounds: Normal heart sounds.  Pulmonary:     Effort: Pulmonary effort is normal.  Breath sounds: Normal breath sounds.  Abdominal:     General: Abdomen is flat. Bowel sounds are normal.     Palpations: Abdomen is soft.  Musculoskeletal:        General: Normal range of motion.     Cervical back: Normal range of motion and neck supple.  Skin:    General: Skin is warm.     Capillary Refill: Capillary refill takes less than 2 seconds.  Neurological:     Mental Status: He is alert.     Comments: Unable to assess orientation.  He will tell me his name.  He is moving all 4 extremities.  Psychiatric:        Attention and Perception: He perceives auditory hallucinations.        Mood and Affect: Affect is angry.        Speech: He is noncommunicative.        Behavior: Behavior is agitated and aggressive.         Thought Content: Thought content is paranoid. Thought content includes suicidal ideation.        Judgment: Judgment is impulsive.     ED Results / Procedures / Treatments   Labs (all labs ordered are listed, but only abnormal results are displayed) Labs Reviewed  COMPREHENSIVE METABOLIC PANEL  [name]  CBC WITH DIFFERENTIAL/PLATELET  RAPID URINE DRUG SCREEN, HOSP PERFORMED    EKG None  Radiology No results found.  Procedures Procedures    Medications Ordered in ED Medications  ziprasidone (GEODON) injection 10 mg (10 mg Intramuscular Given 01/08/23 1219)  sterile water (preservative free) injection (  Given 01/08/23 1219)    ED Course/ Medical Decision Making/ A&P                             Medical Decision Making Amount and/or Complexity of Data Reviewed Labs: ordered.  Risk Prescription drug management.   This patient presents to the ED for concern of psychosis, this involves an extensive number of treatment options, and is a complaint that carries with it a high risk of complications and morbidity.  The differential diagnosis includes psych d/o, infection, electrolyte abn   Co morbidities that complicate the patient evaluation  schizophrenia   Additional history obtained:  Additional history obtained from epic chart review External records from outside source obtained and reviewed including police   Lab Tests:  I Ordered, and personally interpreted labs.  The pertinent results include:  cbc nl, cmp nl, etoh neg   Medicines ordered and prescription drug management:  I ordered medication including geodon  for agitation  Reevaluation of the patient after these medicines showed that the patient improved I have reviewed the patients home medicines and have made adjustments as needed   Consultations Obtained:  I requested consultation with TTS,  and discussed lab and imaging findings as well as pertinent plan - consult pending   Problem List / ED  Course:  Acute psychosis:  likely due to medication noncompliance.  TTS consult placed.   Reevaluation:  After the interventions noted above, I reevaluated the patient and found that they have :improved   Social Determinants of Health:  Lives at home   Dispostion:  Per TTS.  Likely needs inpatient placement.        Final Clinical Impression(s) / ED Diagnoses Final diagnoses:  Acute psychosis ([place])    Rx / DC Orders ED Discharge Orders     None  Isla Pence, MD 01/08/23 1334

## 2023-01-08 NOTE — ED Notes (Signed)
Pt urinated into a styrofoam cup and then dumped it down the sink in the hallway. Reminded pt to collect a urine sample next time he goes to the bathroom.

## 2023-01-09 DIAGNOSIS — F1994 Other psychoactive substance use, unspecified with psychoactive substance-induced mood disorder: Secondary | ICD-10-CM

## 2023-01-09 LAB — RESP PANEL BY RT-PCR (RSV, FLU A&B, COVID)  RVPGX2
Influenza A by PCR: NEGATIVE
Influenza B by PCR: NEGATIVE
Resp Syncytial Virus by PCR: NEGATIVE
SARS Coronavirus 2 by RT PCR: NEGATIVE

## 2023-01-09 MED ORDER — LOPERAMIDE HCL 2 MG PO CAPS: 2.0000 mg | ORAL_CAPSULE | ORAL | Status: DC | PRN

## 2023-01-09 MED ORDER — THIAMINE MONONITRATE 100 MG PO TABS
100.0000 mg | ORAL_TABLET | Freq: Every day | ORAL | Status: DC
  Administered 2023-01-10: 100 mg via ORAL
  Filled 2023-01-09: qty 1

## 2023-01-09 MED ORDER — ONDANSETRON 4 MG PO TBDP: 4.0000 mg | ORAL_TABLET | Freq: Four times a day (QID) | ORAL | Status: DC | PRN

## 2023-01-09 MED ORDER — LORAZEPAM 1 MG PO TABS: 2.0000 mg | ORAL_TABLET | Freq: Once | ORAL | Status: DC

## 2023-01-09 MED ORDER — ADULT MULTIVITAMIN W/MINERALS CH
1.0000 | ORAL_TABLET | Freq: Every day | ORAL | Status: DC
  Administered 2023-01-10: 1 via ORAL
  Filled 2023-01-09: qty 1

## 2023-01-09 MED ORDER — RISPERIDONE 2 MG PO TABS
2.0000 mg | ORAL_TABLET | Freq: Two times a day (BID) | ORAL | Status: DC
  Administered 2023-01-09 – 2023-01-10 (×2): 2 mg via ORAL
  Filled 2023-01-09 (×2): qty 1

## 2023-01-09 MED ORDER — THIAMINE HCL 100 MG/ML IJ SOLN: 100.0000 mg | Freq: Once | INTRAMUSCULAR | Status: DC

## 2023-01-09 MED ORDER — HYDROXYZINE HCL 25 MG PO TABS: 25.0000 mg | ORAL_TABLET | Freq: Four times a day (QID) | ORAL | Status: DC | PRN

## 2023-01-09 MED ORDER — LORAZEPAM 1 MG PO TABS: 1.0000 mg | ORAL_TABLET | Freq: Four times a day (QID) | ORAL | Status: DC | PRN

## 2023-01-09 MED ORDER — ZIPRASIDONE MESYLATE 20 MG IM SOLR: 20.0000 mg | Freq: Once | INTRAMUSCULAR | Status: DC

## 2023-01-09 NOTE — Progress Notes (Signed)
Pt was accepted to [facility] 01/10/2023. Bed assignment: Main campus  Pt meets inpatient criteria per Ricky Ala, NP  Attending Physician will be Leta Jungling, MD  Report can be called to: [phone] (this is a pager, please leave call-back number when giving report)  Pt can arrive after 8 AM  Care Team Notified: Ricky Ala, NP, Lynnda Shields, RN, and Izola Price, Paramedic  [place], LCSWA  01/09/2023 12:40 PM

## 2023-01-09 NOTE — ED Notes (Signed)
Pt came to window to ask for graham crackers. Pt informed snacks end at 8 pm. Re-attempted to ask pt for urine sample. Pt, again, strongly refused. Nursing staff explained to pt what urine sample was for. Pt unable to understand. Pt sts "you dont have the right to ask for a urine".

## 2023-01-09 NOTE — ED Notes (Signed)
Pt finished taking a shower
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
## 2023-01-09 NOTE — ED Notes (Signed)
Received report. Per NT Cindy, pt refused to provide urine sample and nasal swab. Pt resting att. This Probation officer will attempt later

## 2023-01-09 NOTE — ED Notes (Addendum)
Pt is very agitated and keeps coming up to the window asking for writer to find his daughter. 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 Report was given to writer from previous RN and pt has no relationship to his children. Pt currently in his room screaming various things.

## 2023-01-09 NOTE — ED Provider Notes (Signed)
Emergency Medicine Observation Re-evaluation Note  William Mayer is a 32 y.o. male, seen on rounds today.  Pt initially presented to the ED for complaints of Psychiatric Evaluation Currently, the patient is sleeping.  Physical Exam  BP 127/76 (BP Location: Left Arm)   Pulse 62   Temp 98.2 F (36.8 C) (Oral)   Resp 16   SpO2 100%  Physical Exam General: nad Cardiac: rrr Lungs: no distress Psych: paranoid and delusional  ED Course / MDM  EKG:   I have reviewed the labs performed to date as well as medications administered while in observation.  Recent changes in the last 24 hours include redirectable refusing meds and has not required IM meds.  Plan  Current plan is for inpt [facility] at [facility] tomorrow.    Blanchie Dessert, MD 01/09/23 [phone]

## 2023-01-09 NOTE — Progress Notes (Signed)
LCSW Progress Note  [id number]   Armas [facility]  01/09/2023  11:51 AM  Description:   Inpatient Psychiatric Referral  Patient was recommended inpatient per Ricky Ala, NP. There are no available beds at [facility]. Patient was referred to the following facilities:   Destination  Service Provider Address Phone [facility]  [address] [place] [address] [phone]  [facility]  [place], [place] [address] [place]  [facility]  [address] [place] [address] [phone] [place]  [place], [place] [address] [phone] [facility]  [id number] [place] [address] [phone] [facility]  [address] [place], [place] [address] [phone] [place] [place] [address] [place]  [facility]  [address] [place] [address] [phone]  [facility]  [address] [place] [address] [phone]  [place] [place] [address] [phone]  [facility]  [address], [place] [address] [phone]  [facility]  [address] [place] [address] [phone] [facility]  [address] [place] [address] [phone] [facility]  [address], [place] [address] [phone]  [facility]  [address] [place] [address] [phone] [facility]  [address] [place] [address] [phone]  [facility]  [address] [place] [address] [place]  [facility]  [address] [place] [address] [id number]  [place]  [address] [place] [address] [phone] [place] [address] [id number]  [facility]  [address], [place] [address] [id number]  [facility]  [address] [place] [address] [phone]  [place], [place] [phone] [id number] [place]  [address] [place], [place] [address] [phone]    Situation ongoing, CSW to continue following and update chart as more information becomes available.      Denna Haggard, Latanya Presser  01/09/2023 11:51 AM

## 2023-01-09 NOTE — ED Notes (Signed)
Patient calm and cooperative. Slept all night.

## 2023-01-09 NOTE — ED Notes (Signed)
Pt sleeping waiting till he wakes up to repeat vitals.

## 2023-01-09 NOTE — ED Notes (Signed)
Pt agreeable to VS and nasal swab. This writer attempted to obtain urine sample. Pt strongly refused. Pt sts "my mom" requests for urine sample. Pt went off about his mom "thinks she still has custody over me. I don't give a shit if her name is on my birth certificate. I'm not doing it". This Probation officer informed pt urine sample is for an order requested by the MD. Pt sts "I don't have no doctor. You don't know what you're doing. I asked about my daughter and you haven't done anything." This Probation officer showed understanding. Pt currently in the shower, loudly talking to self.

## 2023-01-10 NOTE — ED Notes (Signed)
Patient off unit to facility per provider. Patient alert and cooperative, no s/s of distress at this time. Patient discharge information and belongings given to [facility] for transport. Patient ambulatory off unit, escorted and transported by [facility].

## 2023-01-11 ENCOUNTER — Telehealth (HOSPITAL_COMMUNITY): Payer: Self-pay

## 2023-01-11 NOTE — Telephone Encounter (Signed)
Pt's mother, Cory Munch called to confirm patient's disposition. She reports she had access to pt's AVS and noted that pt was transferred to [facility]. Advised her that we were unable to provide her with any patient information to remain HIPAA compliant, but this RN confirmed that the information she provided was correct.

## 2023-01-16 ENCOUNTER — Telehealth (HOSPITAL_COMMUNITY): Payer: Self-pay

## 2023-01-16 NOTE — Telephone Encounter (Addendum)
Pt mother arrived in person, requesting information on specific treatment plan that is occurring at [facility]. Advised to contact that facility.

## 2023-12-26 ENCOUNTER — Emergency Department (HOSPITAL_COMMUNITY)
Admission: EM | Admit: 2023-12-26 | Discharge: 2023-12-26 | Disposition: A | Discharge status: Home / Self Care | Payer: Self-pay | Attending: Emergency Medicine | Admitting: Emergency Medicine

## 2023-12-26 DIAGNOSIS — H159 Unspecified disorder of sclera: Secondary | ICD-10-CM | POA: Insufficient documentation

## 2023-12-26 DIAGNOSIS — H5789 Other specified disorders of eye and adnexa: Secondary | ICD-10-CM

## 2023-12-26 LAB — CBC WITH DIFFERENTIAL/PLATELET
Abs Immature Granulocytes: 0 10*3/uL (ref 0.00–0.07)
Basophils Absolute: 0 10*3/uL (ref 0.0–0.1)
Basophils Relative: 0 %
Eosinophils Absolute: 0.1 10*3/uL (ref 0.0–0.5)
Eosinophils Relative: 1 %
HCT: 40.9 % (ref 39.0–52.0)
Hemoglobin: 13.5 g/dL (ref 13.0–17.0)
Immature Granulocytes: 0 %
Lymphocytes Relative: 35 %
Lymphs Abs: 1.8 10*3/uL (ref 0.7–4.0)
MCH: 31.5 pg (ref 26.0–34.0)
MCHC: 33 g/dL (ref 30.0–36.0)
MCV: 95.3 fL (ref 80.0–100.0)
Monocytes Absolute: 0.8 10*3/uL (ref 0.1–1.0)
Monocytes Relative: 14 %
Neutro Abs: 2.6 10*3/uL (ref 1.7–7.7)
Neutrophils Relative %: 50 %
Platelets: 269 10*3/uL (ref 150–400)
RBC: 4.29 MIL/uL (ref 4.22–5.81)
RDW: 12.6 % (ref 11.5–15.5)
Smear Review: ADEQUATE
WBC: 5.2 10*3/uL (ref 4.0–10.5)
nRBC: 0 % (ref 0.0–0.2)

## 2023-12-26 LAB — COMPREHENSIVE METABOLIC PANEL
ALT: 17 U/L (ref 0–44)
AST: 30 U/L (ref 15–41)
Albumin: 3.4 g/dL — ABNORMAL LOW (ref 3.5–5.0)
Alkaline Phosphatase: 80 U/L (ref 38–126)
Anion gap: 12 (ref 5–15)
BUN: 14 mg/dL (ref 6–20)
CO2: 22 mmol/L (ref 22–32)
Calcium: 8.4 mg/dL — ABNORMAL LOW (ref 8.9–10.3)
Chloride: 98 mmol/L (ref 98–111)
Creatinine, Ser: 0.97 mg/dL (ref 0.61–1.24)
GFR, Estimated: >60 mL/min (ref >60)
Glucose, Bld: 82 mg/dL (ref 70–99)
Potassium: 3.5 mmol/L (ref 3.5–5.1)
Sodium: 132 mmol/L — ABNORMAL LOW (ref 135–145)
Total Bilirubin: 0.8 mg/dL (ref 0.0–1.2)
Total Protein: 7.6 g/dL (ref 6.5–8.1)

## 2023-12-26 LAB — ETHANOL: Alcohol, Ethyl (B): <10 mg/dL
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
## 2023-12-26 NOTE — ED Triage Notes (Addendum)
 Patient arrived stating he feels like he is going to have a seizure. When asking about symptoms patient just asking for something to eat. Seems to be responding to internal stimuli.

## 2023-12-26 NOTE — ED Notes (Signed)
 ED Provider at bedside.

## 2023-12-26 NOTE — ED Notes (Signed)
 Sandwich tray provided

## 2023-12-26 NOTE — ED Notes (Signed)
 Patient is reluctant for discharge wanting to "wash his feet, wash his face and eat in his room". Given second sandwich tray and another gingerale to go and security walked out patient.

## 2024-01-02 ENCOUNTER — Emergency Department (HOSPITAL_COMMUNITY)
Admission: EM | Admit: 2024-01-02 | Discharge: 2024-01-02 | Disposition: A | Discharge status: Home / Self Care | Payer: Self-pay | Attending: Emergency Medicine | Admitting: Emergency Medicine

## 2024-01-02 ENCOUNTER — Encounter (HOSPITAL_COMMUNITY): Payer: Self-pay | Admitting: Emergency Medicine

## 2024-01-02 ENCOUNTER — Emergency Department (HOSPITAL_COMMUNITY)
Admission: EM | Admit: 2024-01-02 | Discharge: 2024-01-02 | Discharge status: Against Medical Advice | Payer: Self-pay | Attending: Emergency Medicine | Admitting: Emergency Medicine

## 2024-01-02 ENCOUNTER — Other Ambulatory Visit: Payer: Self-pay

## 2024-01-02 DIAGNOSIS — R456 Violent behavior: Secondary | ICD-10-CM

## 2024-01-02 DIAGNOSIS — R4182 Altered mental status, unspecified: Secondary | ICD-10-CM | POA: Insufficient documentation

## 2024-01-02 DIAGNOSIS — T50904A Poisoning by unspecified drugs, medicaments and biological substances, undetermined, initial encounter: Secondary | ICD-10-CM

## 2024-01-02 DIAGNOSIS — Z79899 Other long term (current) drug therapy: Secondary | ICD-10-CM | POA: Insufficient documentation

## 2024-01-02 DIAGNOSIS — T50994A Poisoning by other drugs, medicaments and biological substances, undetermined, initial encounter: Secondary | ICD-10-CM | POA: Insufficient documentation

## 2024-01-02 DIAGNOSIS — F19129 Other psychoactive substance abuse with intoxication, unspecified: Secondary | ICD-10-CM | POA: Insufficient documentation

## 2024-01-02 DIAGNOSIS — F1910 Other psychoactive substance abuse, uncomplicated: Secondary | ICD-10-CM

## 2024-01-02 LAB — BASIC METABOLIC PANEL
Anion gap: 8 (ref 5–15)
BUN: 14 mg/dL (ref 6–20)
CO2: 26 mmol/L (ref 22–32)
Calcium: 8.5 mg/dL — ABNORMAL LOW (ref 8.9–10.3)
Chloride: 105 mmol/L (ref 98–111)
Creatinine, Ser: 1.25 mg/dL — ABNORMAL HIGH (ref 0.61–1.24)
GFR, Estimated: >60 mL/min (ref >60)
Glucose, Bld: 104 mg/dL — ABNORMAL HIGH (ref 70–99)
Potassium: 3.8 mmol/L (ref 3.5–5.1)
Sodium: 139 mmol/L (ref 135–145)

## 2024-01-02 LAB — RAPID URINE DRUG SCREEN, HOSP PERFORMED
Amphetamines: POSITIVE — AB
Barbiturates: NOT DETECTED
Benzodiazepines: NOT DETECTED
Cocaine: POSITIVE — AB
Opiates: NOT DETECTED
Tetrahydrocannabinol: POSITIVE — AB

## 2024-01-02 LAB — CBC WITH DIFFERENTIAL/PLATELET
Abs Immature Granulocytes: 0.04 10*3/uL (ref 0.00–0.07)
Basophils Absolute: 0 10*3/uL (ref 0.0–0.1)
Basophils Relative: 0 %
Eosinophils Absolute: 0 10*3/uL (ref 0.0–0.5)
Eosinophils Relative: 0 %
HCT: 41.4 % (ref 39.0–52.0)
Hemoglobin: 12.9 g/dL — ABNORMAL LOW (ref 13.0–17.0)
Immature Granulocytes: 0 %
Lymphocytes Relative: 11 %
Lymphs Abs: 1.4 10*3/uL (ref 0.7–4.0)
MCH: 30.6 pg (ref 26.0–34.0)
MCHC: 31.2 g/dL (ref 30.0–36.0)
MCV: 98.1 fL (ref 80.0–100.0)
Monocytes Absolute: 0.5 10*3/uL (ref 0.1–1.0)
Monocytes Relative: 4 %
Neutro Abs: 10.7 10*3/uL — ABNORMAL HIGH (ref 1.7–7.7)
Neutrophils Relative %: 85 %
Platelets: 373 10*3/uL (ref 150–400)
RBC: 4.22 MIL/uL (ref 4.22–5.81)
RDW: 12.5 % (ref 11.5–15.5)
WBC: 12.6 10*3/uL — ABNORMAL HIGH (ref 4.0–10.5)
nRBC: 0 % (ref 0.0–0.2)

## 2024-01-02 LAB — ETHANOL: Alcohol, Ethyl (B): <10 mg/dL (ref <10)

## 2024-01-02 LAB — CBG MONITORING, ED: Glucose-Capillary: 98 mg/dL (ref 70–99)

## 2024-01-02 MED ORDER — SODIUM CHLORIDE 0.9 % IV BOLUS
1000.0000 mL | Freq: Once | INTRAVENOUS | Status: AC
  Administered 2024-01-02: 1000 mL via INTRAVENOUS

## 2024-01-02 NOTE — ED Provider Notes (Signed)
 [place] EMERGENCY DEPARTMENT AT [facility] Provider Note   CSN: [id number] Arrival date & time: 01/02/24  1800     History  Chief Complaint  Patient presents with   Drug Overdose    William Mayer is a 33 y.o. male.  HPI Presents via EMS after being here within the past 24 hours.  EMS the patient was found unresponsive, pinpoint pupils, received Narcan, subsequently became awake, alert, and reportedly aggressive towards EMS staff.  After I evaluated the patient, EMS staff later informing that in transport the patient threatened violence towards them. The patient is not forthcoming about what may have transpired due to possible overdose, seemingly denies complaints, but the initial evaluation is essentially laced with threats of violence towards myself, the nurse staff, and nurse tech also attempting to assess the patient.  After the patient was continually violent in spite of verbal de-escalation attempts he was discharged due to violent threats against medical staff.    Home Medications Prior to Admission medications   Medication Sig Start Date End Date Taking? Authorizing Provider  ARIPiprazole (ABILIFY) 10 MG tablet Take 1 tablet (10 mg total) by mouth daily. Patient not taking: Reported on 08/08/2022 06/29/21   Storm Frisk, MD  cetirizine (ZYRTEC) 10 MG tablet Take 1 tablet (10 mg total) by mouth daily. Patient taking differently: Take 10 mg by mouth daily as needed for allergies or rhinitis. 06/29/21   Storm Frisk, MD  fluticasone (FLONASE) 50 MCG/ACT nasal spray Place 1 spray into both nostrils daily. Patient taking differently: Place 1-2 sprays into both nostrils daily as needed for allergies or rhinitis. 06/29/21   Storm Frisk, MD  ibuprofen (ADVIL) 600 MG tablet Take 1 tablet (600 mg total) by mouth every 6 (six) hours as needed for moderate pain. Patient not taking: Reported on 08/08/2022 01/23/21   Storm Frisk, MD  ibuprofen (ADVIL) 800 MG  tablet Take 1 tablet (800 mg total) by mouth every 8 (eight) hours as needed for moderate pain. Patient not taking: Reported on 08/08/2022 02/11/22   Gwyneth Sprout, MD  pantoprazole (PROTONIX) 40 MG tablet Take 1 tablet (40 mg total) by mouth daily. Patient not taking: Reported on 08/08/2022 06/29/21   Storm Frisk, MD  OLANZapine (ZYPREXA) 20 MG tablet Take 1 tablet (20 mg total) by mouth daily. Schedule an OV for additional RF's Patient not taking: Reported on 08/05/2019 09/24/18 08/05/19  Cain Saupe, MD      Allergies    Amoxicillin    Review of Systems   Review of Systems  Physical Exam Updated Vital Signs BP (!) 129/101   Pulse 79   Temp 97.9 F (36.6 C) (Oral)   Resp 16   SpO2 100%  Physical Exam Vitals and nursing note reviewed.  Constitutional:      General: He is not in acute distress.    Appearance: He is well-developed.     Comments: Thin adult male awake and alert no distress  HENT:     Head: Normocephalic and atraumatic.  Eyes:     Conjunctiva/sclera: Conjunctivae normal.  Pulmonary:     Effort: Pulmonary effort is normal. No respiratory distress.     Breath sounds: No stridor.     Comments: No increased work of breathing Abdominal:     General: There is no distension.  Skin:    General: Skin is warm and dry.  Neurological:     Mental Status: He is alert and oriented to person,  place, and time.  Psychiatric:        Mood and Affect: Affect is angry and inappropriate.        Behavior: Behavior is agitated and aggressive.     ED Results / Procedures / Treatments   Labs (all labs ordered are listed, but only abnormal results are displayed) Labs Reviewed - No data to display  EKG None  Radiology No results found.  Procedures Procedures    Medications Ordered in ED Medications - No data to display  ED Course/ Medical Decision Making/ A&P                                 Medical Decision Making As above this adult male presents after  possible overdose the second time in 24 hours being evaluated in our facility.  Patient is not overtly in distress, with no evidence for acute phenomena having already received Narcan in the field.  Patient was continually violent, aggressive, and spite of attempts for de-escalation, patient was discharged.  Amount and/or Complexity of Data Reviewed Independent Historian: EMS External Data Reviewed: notes.    Details: Notes from this morning reviewed  Risk Decision regarding hospitalization.  Final Clinical Impression(s) / ED Diagnoses Final diagnoses:  Overdose of undetermined intent, initial encounter  Violent behavior    Rx / DC Orders ED Discharge Orders     None         Gerhard Munch, MD 01/02/24 Paulo Fruit
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
## 2024-01-02 NOTE — ED Triage Notes (Addendum)
 Patient BIB [facility] c/o OD.  Patient received half a milligram of narcan by EMS.  Patient cursing at staff in triage.  Patient refusing to tell staff what he took tonight.   174 CBG

## 2024-01-22 ENCOUNTER — Other Ambulatory Visit: Payer: Self-pay

## 2024-01-22 ENCOUNTER — Encounter (HOSPITAL_COMMUNITY): Payer: Self-pay

## 2024-01-22 ENCOUNTER — Emergency Department (HOSPITAL_COMMUNITY)
Admission: EM | Admit: 2024-01-22 | Discharge: 2024-01-22 | Disposition: A | Discharge status: Home / Self Care | Payer: Self-pay | Attending: Emergency Medicine | Admitting: Emergency Medicine

## 2024-01-22 DIAGNOSIS — F1910 Other psychoactive substance abuse, uncomplicated: Secondary | ICD-10-CM | POA: Insufficient documentation

## 2024-01-22 DIAGNOSIS — R451 Restlessness and agitation: Secondary | ICD-10-CM | POA: Insufficient documentation

## 2024-01-22 LAB — COMPREHENSIVE METABOLIC PANEL
ALT: 18 U/L (ref 0–44)
AST: 34 U/L (ref 15–41)
Albumin: 3.5 g/dL (ref 3.5–5.0)
Alkaline Phosphatase: 88 U/L (ref 38–126)
Anion gap: 8 (ref 5–15)
BUN: 23 mg/dL — ABNORMAL HIGH (ref 6–20)
CO2: 24 mmol/L (ref 22–32)
Calcium: 8.8 mg/dL — ABNORMAL LOW (ref 8.9–10.3)
Chloride: 108 mmol/L (ref 98–111)
Creatinine, Ser: 1.27 mg/dL — ABNORMAL HIGH (ref 0.61–1.24)
GFR, Estimated: >60 mL/min (ref >60)
Glucose, Bld: 111 mg/dL — ABNORMAL HIGH (ref 70–99)
Potassium: 3.6 mmol/L (ref 3.5–5.1)
Sodium: 140 mmol/L (ref 135–145)
Total Bilirubin: 0.2 mg/dL (ref 0.0–1.2)
Total Protein: 7.7 g/dL (ref 6.5–8.1)

## 2024-01-22 NOTE — ED Notes (Signed)
 Attempted to get vitals, pt ignored me.

## 2024-01-22 NOTE — ED Notes (Signed)
Patient refused temperature
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
## 2024-01-22 NOTE — ED Notes (Signed)
 When attempting to get blood. Pt stated "man why you trying me . Stop poking that shit in my arm." Pt refused to sit up in chair. When asked what was wrong, Pt stated "aint shit wrong with me." When asked why he was at the hospital, Pt stated "man, stop trying me and get this shit out of my arm." Pt would not allow further attempt at blood draw

## 2024-01-22 NOTE — ED Notes (Signed)
 Patient refused to have an EKG done. RN aware

## 2024-01-22 NOTE — ED Notes (Signed)
 Pt refused any further vitals prior to discharge
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
## 2024-01-22 NOTE — ED Notes (Signed)
 Brought Pt back to hallway bed. Pt stated "man, what happened to my room." Pt was informed that hallway bed was all we had available right now. Staff asked Pt if he would like to lie down on bed, however pt stopped talking to staff and continued to sit in chair. "

## 2024-03-27 ENCOUNTER — Inpatient Hospital Stay (HOSPITAL_COMMUNITY)
Admission: EM | Admit: 2024-03-27 | Discharge: 2024-04-01 | DRG: 493 | Disposition: A | Discharge status: Home / Self Care | Payer: MEDICAID | Attending: General Surgery | Admitting: General Surgery

## 2024-03-27 ENCOUNTER — Emergency Department (HOSPITAL_COMMUNITY): Payer: MEDICAID

## 2024-03-27 ENCOUNTER — Other Ambulatory Visit (HOSPITAL_COMMUNITY): Payer: Self-pay

## 2024-03-27 ENCOUNTER — Encounter (HOSPITAL_COMMUNITY): Payer: Self-pay

## 2024-03-27 ENCOUNTER — Other Ambulatory Visit: Payer: Self-pay

## 2024-03-27 DIAGNOSIS — T07XXXA Unspecified multiple injuries, initial encounter: Secondary | ICD-10-CM

## 2024-03-27 DIAGNOSIS — F10920 Alcohol use, unspecified with intoxication, uncomplicated: Secondary | ICD-10-CM

## 2024-03-27 DIAGNOSIS — S82143A Displaced bicondylar fracture of unspecified tibia, initial encounter for closed fracture: Secondary | ICD-10-CM | POA: Diagnosis present

## 2024-03-27 DIAGNOSIS — S022XXA Fracture of nasal bones, initial encounter for closed fracture: Secondary | ICD-10-CM

## 2024-03-27 DIAGNOSIS — Z8616 Personal history of COVID-19: Secondary | ICD-10-CM

## 2024-03-27 DIAGNOSIS — Y907 Blood alcohol level of 200-239 mg/100 ml: Secondary | ICD-10-CM | POA: Diagnosis present

## 2024-03-27 DIAGNOSIS — F1024 Alcohol dependence with alcohol-induced mood disorder: Secondary | ICD-10-CM | POA: Diagnosis present

## 2024-03-27 DIAGNOSIS — Z23 Encounter for immunization: Secondary | ICD-10-CM

## 2024-03-27 DIAGNOSIS — D62 Acute posthemorrhagic anemia: Secondary | ICD-10-CM | POA: Diagnosis not present

## 2024-03-27 DIAGNOSIS — Y92410 Unspecified street and highway as the place of occurrence of the external cause: Secondary | ICD-10-CM

## 2024-03-27 DIAGNOSIS — Z91148 Patient's other noncompliance with medication regimen for other reason: Secondary | ICD-10-CM

## 2024-03-27 DIAGNOSIS — F10229 Alcohol dependence with intoxication, unspecified: Secondary | ICD-10-CM | POA: Diagnosis present

## 2024-03-27 DIAGNOSIS — S1191XA Laceration without foreign body of unspecified part of neck, initial encounter: Secondary | ICD-10-CM | POA: Diagnosis present

## 2024-03-27 DIAGNOSIS — Z880 Allergy status to penicillin: Secondary | ICD-10-CM

## 2024-03-27 DIAGNOSIS — S0081XA Abrasion of other part of head, initial encounter: Secondary | ICD-10-CM | POA: Diagnosis present

## 2024-03-27 DIAGNOSIS — Z87820 Personal history of traumatic brain injury: Secondary | ICD-10-CM

## 2024-03-27 DIAGNOSIS — G40509 Epileptic seizures related to external causes, not intractable, without status epilepticus: Secondary | ICD-10-CM | POA: Diagnosis present

## 2024-03-27 DIAGNOSIS — Z5900 Homelessness unspecified: Secondary | ICD-10-CM

## 2024-03-27 DIAGNOSIS — S82142A Displaced bicondylar fracture of left tibia, initial encounter for closed fracture: Principal | ICD-10-CM | POA: Diagnosis present

## 2024-03-27 DIAGNOSIS — Z79899 Other long term (current) drug therapy: Secondary | ICD-10-CM

## 2024-03-27 DIAGNOSIS — S82832A Other fracture of upper and lower end of left fibula, initial encounter for closed fracture: Secondary | ICD-10-CM | POA: Diagnosis present

## 2024-03-27 DIAGNOSIS — F209 Schizophrenia, unspecified: Secondary | ICD-10-CM | POA: Diagnosis present

## 2024-03-27 DIAGNOSIS — Z818 Family history of other mental and behavioral disorders: Secondary | ICD-10-CM

## 2024-03-27 DIAGNOSIS — F1410 Cocaine abuse, uncomplicated: Secondary | ICD-10-CM | POA: Diagnosis present

## 2024-03-27 HISTORY — DX: Schizophrenia, unspecified: F20.9

## 2024-03-27 LAB — COMPREHENSIVE METABOLIC PANEL WITH GFR
ALT: 20 U/L (ref 0–44)
AST: 42 U/L — ABNORMAL HIGH (ref 15–41)
Albumin: 3.7 g/dL (ref 3.5–5.0)
Alkaline Phosphatase: 55 U/L (ref 38–126)
Anion gap: 19 — ABNORMAL HIGH (ref 5–15)
BUN: 12 mg/dL (ref 6–20)
CO2: 18 mmol/L — ABNORMAL LOW (ref 22–32)
Calcium: 8.8 mg/dL — ABNORMAL LOW (ref 8.9–10.3)
Chloride: 105 mmol/L (ref 98–111)
Creatinine, Ser: 0.94 mg/dL (ref 0.61–1.24)
GFR, Estimated: >60 mL/min (ref >60)
Glucose, Bld: 135 mg/dL — ABNORMAL HIGH (ref 70–99)
Potassium: 3.9 mmol/L (ref 3.5–5.1)
Sodium: 142 mmol/L (ref 135–145)
Total Bilirubin: 0.8 mg/dL (ref 0.0–1.2)
Total Protein: 6.9 g/dL (ref 6.5–8.1)

## 2024-03-27 LAB — I-STAT CHEM 8, ED
BUN: 13 mg/dL (ref 6–20)
Calcium, Ion: 1.05 mmol/L — ABNORMAL LOW (ref 1.15–1.40)
Chloride: 106 mmol/L (ref 98–111)
Creatinine, Ser: 1.2 mg/dL (ref 0.61–1.24)
Glucose, Bld: 134 mg/dL — ABNORMAL HIGH (ref 70–99)
HCT: 42 % (ref 39.0–52.0)
Hemoglobin: 14.3 g/dL (ref 13.0–17.0)
Potassium: 3.7 mmol/L (ref 3.5–5.1)
Sodium: 140 mmol/L (ref 135–145)
TCO2: 20 mmol/L — ABNORMAL LOW (ref 22–32)

## 2024-03-27 LAB — SAMPLE TO BLOOD BANK

## 2024-03-27 LAB — I-STAT CG4 LACTIC ACID, ED: Lactic Acid, Venous: 2.2 mmol/L (ref 0.5–1.9)

## 2024-03-27 LAB — CBC
HCT: 42.2 % (ref 39.0–52.0)
Hemoglobin: 13.8 g/dL (ref 13.0–17.0)
MCH: 31.7 pg (ref 26.0–34.0)
MCHC: 32.7 g/dL (ref 30.0–36.0)
MCV: 96.8 fL (ref 80.0–100.0)
Platelets: 268 10*3/uL (ref 150–400)
RBC: 4.36 MIL/uL (ref 4.22–5.81)
RDW: 12.6 % (ref 11.5–15.5)
WBC: 8.5 10*3/uL (ref 4.0–10.5)
nRBC: 0 % (ref 0.0–0.2)

## 2024-03-27 LAB — PROTIME-INR
INR: 1.1 (ref 0.8–1.2)
Prothrombin Time: 14.3 s (ref 11.4–15.2)

## 2024-03-27 LAB — ETHANOL: Alcohol, Ethyl (B): 212 mg/dL — ABNORMAL HIGH (ref <15)

## 2024-03-27 MED ORDER — IOHEXOL 350 MG/ML SOLN
75.0000 mL | Freq: Once | INTRAVENOUS | Status: AC | PRN
  Administered 2024-03-27: 75 mL via INTRAVENOUS

## 2024-03-27 MED ORDER — LORAZEPAM 2 MG/ML IJ SOLN
1.0000 mg | Freq: Once | INTRAMUSCULAR | Status: AC
  Filled 2024-03-27: qty 1

## 2024-03-27 MED ORDER — LORAZEPAM 2 MG/ML IJ SOLN
INTRAMUSCULAR | Status: AC
  Administered 2024-03-27: 1 mg via INTRAVENOUS
  Filled 2024-03-27: qty 1

## 2024-03-27 MED ORDER — FENTANYL CITRATE PF 50 MCG/ML IJ SOSY
PREFILLED_SYRINGE | INTRAMUSCULAR | Status: AC
  Filled 2024-03-27: qty 1

## 2024-03-27 MED ORDER — ONDANSETRON HCL 4 MG/2ML IJ SOLN
4.0000 mg | Freq: Once | INTRAMUSCULAR | Status: AC
  Administered 2024-03-27: 4 mg via INTRAVENOUS

## 2024-03-27 MED ORDER — TETANUS-DIPHTH-ACELL PERTUSSIS 5-2.5-18.5 LF-MCG/0.5 IM SUSY
0.5000 mL | PREFILLED_SYRINGE | Freq: Once | INTRAMUSCULAR | Status: AC
  Administered 2024-03-27: 0.5 mL via INTRAMUSCULAR
  Filled 2024-03-27: qty 0.5

## 2024-03-27 MED ORDER — FENTANYL CITRATE PF 50 MCG/ML IJ SOSY
50.0000 ug | PREFILLED_SYRINGE | Freq: Once | INTRAMUSCULAR | Status: AC
  Administered 2024-03-27: 50 ug via INTRAVENOUS

## 2024-03-27 NOTE — Discharge Instructions (Addendum)
 [facility] Discharge Instructions   General Discharge Instructions  WEIGHT BEARING STATUS: Nonweightbearing left leg  RANGE OF MOTION/ACTIVITY: Unrestricted range of motion of the knee  Wound Care: You may remove your surgical dressing on postoperative day #2 (04/01/2024). Incisions can be left open to air if there is no drainage. Once the incision is completely dry and without drainage, it may be left open to air out.  Showering may begin postoperative day #3 (04/02/2024).  Clean incision gently with soap and water .  DVT/PE prophylaxis: Aspirin  81 mg daily x 30 days  Diet: as you were eating previously.  Can use over the counter stool softeners and bowel preparations, such as Miralax , to help with bowel movements.  Narcotics can be constipating.  Be sure to drink plenty of fluids  PAIN MEDICATION USE AND EXPECTATIONS  You have likely been given narcotic medications to help control your pain.  After a traumatic event that results in an fracture (broken bone) with or without surgery, it is ok to use narcotic pain medications to help control one's pain.  We understand that everyone responds to pain differently and each individual patient will be evaluated on a regular basis for the continued need for narcotic medications. Ideally, narcotic medication use should last no more than 6-8 weeks (coinciding with fracture healing).   As a patient it is your responsibility as well to monitor narcotic medication use and report the amount and frequency you use these medications when you come to your office visit.   We would also advise that if you are using narcotic medications, you should take a dose prior to therapy to maximize you participation.  IF YOU ARE ON NARCOTIC MEDICATIONS IT IS NOT PERMISSIBLE TO OPERATE A MOTOR VEHICLE (MOTORCYCLE/CAR/TRUCK/MOPED) OR HEAVY MACHINERY DO NOT MIX NARCOTICS WITH OTHER CNS (CENTRAL NERVOUS SYSTEM) DEPRESSANTS SUCH AS ALCOHOL  POST-OPERATIVE OPIOID  TAPER INSTRUCTIONS: It is important to wean off of your opioid medication as soon as possible. If you do not need pain medication after your surgery it is ok to stop day one. Opioids include: Codeine, Hydrocodone(Norco, Vicodin), Oxycodone (Percocet, oxycontin ) and hydromorphone  amongst others.  Long term and even short term use of opiods can cause: Increased pain response Dependence Constipation Depression Respiratory depression And more.  Withdrawal symptoms can include Flu like symptoms Nausea, vomiting And more Techniques to manage these symptoms Hydrate well Eat regular healthy meals Stay active Use relaxation techniques(deep breathing, meditating, yoga) Do Not substitute Alcohol to help with tapering If you have been on opioids for less than two weeks and do not have pain than it is ok to stop all together.  Plan to wean off of opioids This plan should start within one week post op of your fracture surgery  Maintain the same interval or time between taking each dose and first decrease the dose.  Cut the total daily intake of opioids by one tablet each day Next start to increase the time between doses. The last dose that should be eliminated is the evening dose.    STOP SMOKING OR USING NICOTINE  PRODUCTS!!!!  As discussed nicotine  severely impairs your body's ability to heal surgical and traumatic wounds but also impairs bone healing.  Wounds and bone heal by forming microscopic blood vessels (angiogenesis) and nicotine  is a vasoconstrictor (essentially, shrinks blood vessels).  Therefore, if vasoconstriction occurs to these microscopic blood vessels they essentially disappear and are unable to deliver necessary nutrients to the healing tissue.  This is one modifiable factor that  you can do to dramatically increase your chances of healing your injury.  (This means no smoking, no nicotine  gum, patches, etc)  DO NOT USE NONSTEROIDAL ANTI-INFLAMMATORY DRUGS (NSAID'S)  Using  products such as Advil  (ibuprofen ), Aleve  (naproxen ), Motrin  (ibuprofen ) for additional pain control during fracture healing can delay and/or prevent the healing response.  If you would like to take over the counter (OTC) medication, Tylenol  (acetaminophen ) is ok.  However, some narcotic medications that are given for pain control contain acetaminophen  as well. Therefore, you should not exceed more than 4000 mg of tylenol  in a day if you do not have liver disease.  Also note that there are may OTC medicines, such as cold medicines and allergy medicines that my contain tylenol  as well.  If you have any questions about medications and/or interactions please ask your doctor/PA or your pharmacist.      ICE AND ELEVATE INJURED/OPERATIVE EXTREMITY  Using ice and elevating the injured extremity above your heart can help with swelling and pain control.  Icing in a pulsatile fashion, such as 20 minutes on and 20 minutes off, can be followed.    Do not place ice directly on skin. Make sure there is a barrier between to skin and the ice pack.    Using frozen items such as frozen peas works well as the conform nicely to the are that needs to be iced.  USE AN ACE WRAP OR TED HOSE FOR SWELLING CONTROL  In addition to icing and elevation, Ace wraps or TED hose are used to help limit and resolve swelling.  It is recommended to use Ace wraps or TED hose until you are informed to stop.    When using Ace Wraps start the wrapping distally (farthest away from the body) and wrap proximally (closer to the body)   Example: If you had surgery on your leg or thing and you do not have a splint on, start the ace wrap at the toes and work your way up to the thigh        If you had surgery on your upper extremity and do not have a splint on, start the ace wrap at your fingers and work your way up to the upper arm   CALL THE OFFICE FOR MEDICATION REFILLS OR WITH ANY QUESTIONS/CONCERNS: [phone]   VISIT OUR WEBSITE FOR  ADDITIONAL INFORMATION: [email or web address]    Discharge Wound Care Instructions  Do NOT apply any ointments, solutions or lotions to pin sites or surgical wounds.  These prevent needed drainage and even though solutions like hydrogen peroxide kill bacteria, they also damage cells lining the pin sites that help fight infection.  Applying lotions or ointments can keep the wounds moist and can cause them to breakdown and open up as well. This can increase the risk for infection. When in doubt call the office.  Surgical incisions should be dressed daily.  If any drainage is noted, use one layer of adaptic or Mepitel, then gauze, Kerlix, and an ace wrap. - These dressing supplies should be available at local medical supply stores ([facility], [facility], etc) as well as Insurance claims handler ([facility], [facility], [facility], etc)  Once the incision is completely dry and without drainage, it may be left open to air out.  Showering may begin 36-48 hours later.  Cleaning gently with soap and water .  Traumatic wounds should be dressed daily as well.    One layer of adaptic, gauze, Kerlix, then ace wrap.  The adaptic can be  discontinued once the draining has ceased    If you have a wet to dry dressing: wet the gauze with saline the squeeze as much saline out so the gauze is moist (not soaking wet), place moistened gauze over wound, then place a dry gauze over the moist one, followed by Kerlix wrap, then ace wrap.    Call office for the following: Temperature greater than 101F Persistent nausea and vomiting Severe uncontrolled pain Redness, tenderness, or signs of infection (pain, swelling, redness, odor or green/yellow discharge around the site) Difficulty breathing, headache or visual disturbances Hives Persistent dizziness or light-headedness Extreme fatigue Any other questions or concerns you may have after discharge  In an emergency, call 911 or go to an Emergency Department at a nearby  hospital  OTHER HELPFUL INFORMATION  If you had a block, it will wear off between 8-24 hrs postop typically.  This is period when your pain may go from nearly zero to the pain you would have had postop without the block.  This is an abrupt transition but nothing dangerous is happening.  You may take an extra dose of narcotic when this happens.  You should wean off your narcotic medicines as soon as you are able.  Most patients will be off or using minimal narcotics before their first postop appointment.   We suggest you use the pain medication the first night prior to going to bed, in order to ease any pain when the anesthesia wears off. You should avoid taking pain medications on an empty stomach as it will make you nauseous.  Do not drink alcoholic beverages or take illicit drugs when taking pain medications.  In most states it is against the law to drive while you are in a splint or sling.  And certainly against the law to drive while taking narcotics.  You may return to work/school in the next couple of days when you feel up to it.   Pain medication may make you constipated.  Below are a few solutions to try in this order: Decrease the amount of pain medication if you aren't having pain. Drink lots of decaffeinated fluids. Drink prune juice and/or each dried prunes  If the first 3 don't work start with additional solutions Take Colace - an over-the-counter stool softener Take Senokot - an over-the-counter laxative Take Miralax  - a stronger over-the-counter laxative   PSYCHIATRY  You received Abilify  Maintena Intramuscular injection 400mg  on 03/31/2024 and are due for your next injection 04/28/2024. Please continue to take Abilify  tablets for 12 more days before discontinuing. After you should only require monthly Abilify  Maintena injection.

## 2024-03-27 NOTE — ED Notes (Signed)
 Patient transported to CT

## 2024-03-27 NOTE — Progress Notes (Signed)
 Mother Ritta Chessman [phone] contacted, she will come to ED.
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
## 2024-03-27 NOTE — ED Notes (Signed)
 Patient verbally aggressive with provider when trying to clean wounds, patient uncooperative with wound care.

## 2024-03-28 ENCOUNTER — Encounter (HOSPITAL_COMMUNITY): Payer: Self-pay | Admitting: Emergency Medicine

## 2024-03-28 ENCOUNTER — Inpatient Hospital Stay (HOSPITAL_COMMUNITY): Payer: MEDICAID

## 2024-03-28 DIAGNOSIS — S82143A Displaced bicondylar fracture of unspecified tibia, initial encounter for closed fracture: Secondary | ICD-10-CM | POA: Diagnosis present

## 2024-03-28 DIAGNOSIS — S1191XA Laceration without foreign body of unspecified part of neck, initial encounter: Secondary | ICD-10-CM | POA: Diagnosis present

## 2024-03-28 DIAGNOSIS — Y907 Blood alcohol level of 200-239 mg/100 ml: Secondary | ICD-10-CM | POA: Diagnosis present

## 2024-03-28 DIAGNOSIS — G40509 Epileptic seizures related to external causes, not intractable, without status epilepticus: Secondary | ICD-10-CM | POA: Diagnosis present

## 2024-03-28 DIAGNOSIS — S022XXA Fracture of nasal bones, initial encounter for closed fracture: Secondary | ICD-10-CM | POA: Diagnosis present

## 2024-03-28 DIAGNOSIS — Z79899 Other long term (current) drug therapy: Secondary | ICD-10-CM | POA: Diagnosis not present

## 2024-03-28 DIAGNOSIS — F200 Paranoid schizophrenia: Secondary | ICD-10-CM | POA: Diagnosis not present

## 2024-03-28 DIAGNOSIS — Z87820 Personal history of traumatic brain injury: Secondary | ICD-10-CM | POA: Diagnosis not present

## 2024-03-28 DIAGNOSIS — S0081XA Abrasion of other part of head, initial encounter: Secondary | ICD-10-CM | POA: Diagnosis present

## 2024-03-28 DIAGNOSIS — D62 Acute posthemorrhagic anemia: Secondary | ICD-10-CM | POA: Diagnosis not present

## 2024-03-28 DIAGNOSIS — Y92410 Unspecified street and highway as the place of occurrence of the external cause: Secondary | ICD-10-CM | POA: Diagnosis not present

## 2024-03-28 DIAGNOSIS — F1410 Cocaine abuse, uncomplicated: Secondary | ICD-10-CM | POA: Diagnosis present

## 2024-03-28 DIAGNOSIS — S82832A Other fracture of upper and lower end of left fibula, initial encounter for closed fracture: Secondary | ICD-10-CM | POA: Diagnosis present

## 2024-03-28 DIAGNOSIS — S82122A Displaced fracture of lateral condyle of left tibia, initial encounter for closed fracture: Secondary | ICD-10-CM | POA: Diagnosis not present

## 2024-03-28 DIAGNOSIS — Z880 Allergy status to penicillin: Secondary | ICD-10-CM | POA: Diagnosis not present

## 2024-03-28 DIAGNOSIS — F10229 Alcohol dependence with intoxication, unspecified: Secondary | ICD-10-CM | POA: Diagnosis present

## 2024-03-28 DIAGNOSIS — S82142A Displaced bicondylar fracture of left tibia, initial encounter for closed fracture: Secondary | ICD-10-CM | POA: Diagnosis present

## 2024-03-28 DIAGNOSIS — F209 Schizophrenia, unspecified: Secondary | ICD-10-CM | POA: Diagnosis present

## 2024-03-28 DIAGNOSIS — Z818 Family history of other mental and behavioral disorders: Secondary | ICD-10-CM | POA: Diagnosis not present

## 2024-03-28 DIAGNOSIS — Z23 Encounter for immunization: Secondary | ICD-10-CM | POA: Diagnosis not present

## 2024-03-28 DIAGNOSIS — Z91148 Patient's other noncompliance with medication regimen for other reason: Secondary | ICD-10-CM | POA: Diagnosis not present

## 2024-03-28 DIAGNOSIS — Z5900 Homelessness unspecified: Secondary | ICD-10-CM | POA: Diagnosis not present

## 2024-03-28 DIAGNOSIS — F1024 Alcohol dependence with alcohol-induced mood disorder: Secondary | ICD-10-CM | POA: Diagnosis present

## 2024-03-28 DIAGNOSIS — Z8616 Personal history of COVID-19: Secondary | ICD-10-CM | POA: Diagnosis not present

## 2024-03-28 LAB — BASIC METABOLIC PANEL WITH GFR
Anion gap: 15 (ref 5–15)
BUN: 14 mg/dL (ref 6–20)
CO2: 19 mmol/L — ABNORMAL LOW (ref 22–32)
Calcium: 8.9 mg/dL (ref 8.9–10.3)
Chloride: 103 mmol/L (ref 98–111)
Creatinine, Ser: 0.88 mg/dL (ref 0.61–1.24)
GFR, Estimated: >60 mL/min (ref >60)
Glucose, Bld: 131 mg/dL — ABNORMAL HIGH (ref 70–99)
Potassium: 3.8 mmol/L (ref 3.5–5.1)
Sodium: 137 mmol/L (ref 135–145)

## 2024-03-28 LAB — URINALYSIS, ROUTINE W REFLEX MICROSCOPIC
Bacteria, UA: NONE SEEN
Bilirubin Urine: NEGATIVE
Glucose, UA: NEGATIVE mg/dL
Ketones, ur: 5 mg/dL — AB
Leukocytes,Ua: NEGATIVE
Nitrite: NEGATIVE
Protein, ur: NEGATIVE mg/dL
Specific Gravity, Urine: 1.043 — ABNORMAL HIGH (ref 1.005–1.030)
pH: 5 (ref 5.0–8.0)

## 2024-03-28 LAB — CBC
HCT: 41.7 % (ref 39.0–52.0)
Hemoglobin: 13.7 g/dL (ref 13.0–17.0)
MCH: 31.1 pg (ref 26.0–34.0)
MCHC: 32.9 g/dL (ref 30.0–36.0)
MCV: 94.8 fL (ref 80.0–100.0)
Platelets: 278 10*3/uL (ref 150–400)
RBC: 4.4 MIL/uL (ref 4.22–5.81)
RDW: 12.8 % (ref 11.5–15.5)
WBC: 9.8 10*3/uL (ref 4.0–10.5)
nRBC: 0 % (ref 0.0–0.2)

## 2024-03-28 LAB — HIV ANTIBODY (ROUTINE TESTING W REFLEX): HIV Screen 4th Generation wRfx: NONREACTIVE

## 2024-03-28 MED ORDER — ENOXAPARIN SODIUM 30 MG/0.3ML IJ SOSY
30.0000 mg | PREFILLED_SYRINGE | Freq: Two times a day (BID) | INTRAMUSCULAR | Status: AC
  Filled 2024-03-28: qty 0.3

## 2024-03-28 MED ORDER — METHOCARBAMOL 500 MG PO TABS
500.0000 mg | ORAL_TABLET | Freq: Three times a day (TID) | ORAL | Status: AC
  Administered 2024-03-28 – 2024-03-30 (×6): 500 mg via ORAL
  Filled 2024-03-28 (×7): qty 1

## 2024-03-28 MED ORDER — METOPROLOL TARTRATE 5 MG/5ML IV SOLN: 5.0000 mg | Freq: Four times a day (QID) | INTRAVENOUS | Status: DC | PRN

## 2024-03-28 MED ORDER — WHITE PETROLATUM EX OINT
TOPICAL_OINTMENT | Freq: Two times a day (BID) | CUTANEOUS | Status: DC
  Administered 2024-03-28 – 2024-04-01 (×9): 0.2 via TOPICAL
  Filled 2024-03-28: qty 28.35

## 2024-03-28 MED ORDER — FENTANYL CITRATE PF 50 MCG/ML IJ SOSY
PREFILLED_SYRINGE | INTRAMUSCULAR | Status: AC
  Filled 2024-03-28: qty 2

## 2024-03-28 MED ORDER — LORAZEPAM 2 MG/ML IJ SOLN: 0.0000 mg | Freq: Two times a day (BID) | INTRAMUSCULAR | Status: AC

## 2024-03-28 MED ORDER — ACETAMINOPHEN 500 MG PO TABS
1000.0000 mg | ORAL_TABLET | Freq: Four times a day (QID) | ORAL | Status: DC
  Administered 2024-03-28 – 2024-03-31 (×11): 1000 mg via ORAL
  Filled 2024-03-28 (×11): qty 2

## 2024-03-28 MED ORDER — ADULT MULTIVITAMIN W/MINERALS CH
1.0000 | ORAL_TABLET | Freq: Every day | ORAL | Status: DC
  Administered 2024-03-28 – 2024-04-01 (×5): 1 via ORAL
  Filled 2024-03-28 (×5): qty 1

## 2024-03-28 MED ORDER — ONDANSETRON 4 MG PO TBDP: 4.0000 mg | ORAL_TABLET | Freq: Four times a day (QID) | ORAL | Status: DC | PRN

## 2024-03-28 MED ORDER — OXYCODONE HCL 5 MG PO TABS: 5.0000 mg | ORAL_TABLET | ORAL | Status: DC | PRN

## 2024-03-28 MED ORDER — THIAMINE HCL 100 MG/ML IJ SOLN
100.0000 mg | Freq: Every day | INTRAMUSCULAR | Status: DC
  Filled 2024-03-28: qty 2

## 2024-03-28 MED ORDER — LORAZEPAM 1 MG PO TABS: 1.0000 mg | ORAL_TABLET | ORAL | Status: AC | PRN

## 2024-03-28 MED ORDER — HYDROMORPHONE HCL 1 MG/ML IJ SOLN
0.5000 mg | INTRAMUSCULAR | Status: DC | PRN
  Administered 2024-03-28 (×2): 0.5 mg via INTRAVENOUS
  Filled 2024-03-28 (×2): qty 0.5

## 2024-03-28 MED ORDER — FENTANYL CITRATE PF 50 MCG/ML IJ SOSY
100.0000 ug | PREFILLED_SYRINGE | Freq: Once | INTRAMUSCULAR | Status: AC
  Administered 2024-03-28: 50 ug via INTRAVENOUS

## 2024-03-28 MED ORDER — OXYCODONE HCL 5 MG PO TABS
10.0000 mg | ORAL_TABLET | ORAL | Status: DC | PRN
  Administered 2024-03-28 – 2024-03-31 (×6): 10 mg via ORAL
  Filled 2024-03-28 (×8): qty 2

## 2024-03-28 MED ORDER — HYDRALAZINE HCL 20 MG/ML IJ SOLN: 10.0000 mg | INTRAMUSCULAR | Status: DC | PRN

## 2024-03-28 MED ORDER — LACTATED RINGERS IV SOLN: INTRAVENOUS | Status: AC

## 2024-03-28 MED ORDER — FOLIC ACID 1 MG PO TABS
1.0000 mg | ORAL_TABLET | Freq: Every day | ORAL | Status: DC
  Administered 2024-03-28 – 2024-04-01 (×5): 1 mg via ORAL
  Filled 2024-03-28 (×5): qty 1

## 2024-03-28 MED ORDER — DOCUSATE SODIUM 100 MG PO CAPS
100.0000 mg | ORAL_CAPSULE | Freq: Two times a day (BID) | ORAL | Status: DC
  Administered 2024-03-28 – 2024-03-30 (×5): 100 mg via ORAL
  Filled 2024-03-28 (×5): qty 1

## 2024-03-28 MED ORDER — MUPIROCIN 2 % EX OINT
TOPICAL_OINTMENT | Freq: Two times a day (BID) | CUTANEOUS | Status: DC
  Filled 2024-03-28 (×3): qty 22

## 2024-03-28 MED ORDER — METHOCARBAMOL 1000 MG/10ML IJ SOLN
500.0000 mg | Freq: Three times a day (TID) | INTRAMUSCULAR | Status: AC
  Administered 2024-03-28 – 2024-03-30 (×2): 500 mg via INTRAVENOUS
  Filled 2024-03-28 (×3): qty 10

## 2024-03-28 MED ORDER — THIAMINE MONONITRATE 100 MG PO TABS
100.0000 mg | ORAL_TABLET | Freq: Every day | ORAL | Status: DC
  Administered 2024-03-28 – 2024-04-01 (×5): 100 mg via ORAL
  Filled 2024-03-28 (×5): qty 1

## 2024-03-28 MED ORDER — ONDANSETRON HCL 4 MG/2ML IJ SOLN
4.0000 mg | Freq: Four times a day (QID) | INTRAMUSCULAR | Status: DC | PRN
  Administered 2024-03-30: 4 mg via INTRAVENOUS

## 2024-03-28 MED ORDER — LORAZEPAM 2 MG/ML IJ SOLN
0.0000 mg | Freq: Four times a day (QID) | INTRAMUSCULAR | Status: AC
  Administered 2024-03-28: 1 mg via INTRAVENOUS
  Filled 2024-03-28: qty 1

## 2024-03-28 MED ORDER — POLYETHYLENE GLYCOL 3350 17 G PO PACK
17.0000 g | PACK | Freq: Every day | ORAL | Status: DC | PRN
Start: 2024-03-28 — End: 2024-03-31

## 2024-03-28 NOTE — Progress Notes (Signed)
 PT Cancellation Note  Patient Details Name: William Mayer MRN: [id number] DOB: 05/09/1991   Cancelled Treatment:    Reason Eval/Treat Not Completed: Patient's level of consciousness (Pt asleep, stuperous, requires strong noxious stimuli to nail bed to react pulling back hand but no moan and eyes remain closed. will follow up at later date/time as pt more alert and schedule allows.)     Corbin Dess PT, DPT Acute Rehabilitation Services Office [phone]  03/28/24 11:51 AM

## 2024-03-28 NOTE — Plan of Care (Signed)
  Problem: Education: Goal: Knowledge of General Education information will improve Description: Including pain rating scale, medication(s)/side effects and non-pharmacologic comfort measures Outcome: Not Progressing   Problem: Health Behavior/Discharge Planning: Goal: Ability to manage health-related needs will improve Outcome: Not Progressing   Problem: Clinical Measurements: Goal: Ability to maintain clinical measurements within normal limits will improve Outcome: Progressing Goal: Will remain free from infection Outcome: Progressing Goal: Diagnostic test results will improve Outcome: Progressing Goal: Respiratory complications will improve Outcome: Progressing Goal: Cardiovascular complication will be avoided Outcome: Progressing   Problem: Nutrition: Goal: Adequate nutrition will be maintained Outcome: Progressing   Problem: Pain Managment: Goal: General experience of comfort will improve and/or be controlled Outcome: Progressing   Problem: Safety: Goal: Ability to remain free from injury will improve Outcome: Progressing   Problem: Skin Integrity: Goal: Risk for impaired skin integrity will decrease Outcome: Progressing   Problem: Skin Integrity: Goal: Risk for impaired skin integrity will decrease Outcome: Progressing
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
## 2024-03-28 NOTE — ED Notes (Signed)
Ortho Tech called for splint  ?
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
## 2024-03-28 NOTE — Progress Notes (Signed)
 Orthopedic Tech Progress Note Patient Details:  William Mayer 1991-03-06 [id number]  Ortho Devices Type of Ortho Device: Ace wrap, Cotton web roll, Long leg splint Ortho Device/Splint Location: LLE Ortho Device/Splint Interventions: Ordered, Application, Adjustment   Post Interventions Patient Tolerated: Difficulty with ambulation Instructions Provided: Care of device  ED MD called me back to inform me they needed a long leg splint applied to the LLE. The patient had difficulty at first with postioning the leg in anatomical position and I then asked the nurse if we could give him pain medication to tolerate the postioning. Lastly with the help of his mother and the MD I was able to apply the splint.  Daune Divirgilio L Lilyanne Mcquown 03/28/2024, 1:35 AM
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
## 2024-03-29 DIAGNOSIS — F200 Paranoid schizophrenia: Secondary | ICD-10-CM | POA: Diagnosis not present

## 2024-03-29 MED ORDER — ARIPIPRAZOLE 10 MG PO TABS
10.0000 mg | ORAL_TABLET | Freq: Every day | ORAL | Status: DC
  Administered 2024-03-29 – 2024-03-31 (×3): 10 mg via ORAL
  Filled 2024-03-29 (×3): qty 1
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
## 2024-03-30 ENCOUNTER — Other Ambulatory Visit: Payer: Self-pay

## 2024-03-30 ENCOUNTER — Encounter (HOSPITAL_COMMUNITY): Payer: Self-pay | Admitting: General Surgery

## 2024-03-30 ENCOUNTER — Inpatient Hospital Stay (HOSPITAL_COMMUNITY): Payer: MEDICAID

## 2024-03-30 ENCOUNTER — Encounter (HOSPITAL_COMMUNITY): Admission: EM | Disposition: A | Discharge status: Home / Self Care | Payer: Self-pay | Source: Home / Self Care

## 2024-03-30 ENCOUNTER — Inpatient Hospital Stay (HOSPITAL_COMMUNITY): Payer: MEDICAID | Admitting: Anesthesiology

## 2024-03-30 DIAGNOSIS — S82122A Displaced fracture of lateral condyle of left tibia, initial encounter for closed fracture: Secondary | ICD-10-CM | POA: Diagnosis not present

## 2024-03-30 HISTORY — PX: ORIF TIBIA PLATEAU: SHX2132

## 2024-03-30 LAB — VITAMIN D 25 HYDROXY (VIT D DEFICIENCY, FRACTURES): Vit D, 25-Hydroxy: 26.26 ng/mL — ABNORMAL LOW (ref 30–100)

## 2024-03-30 LAB — CBC
HCT: 30.9 % — ABNORMAL LOW (ref 39.0–52.0)
Hemoglobin: 10.1 g/dL — ABNORMAL LOW (ref 13.0–17.0)
MCH: 31.4 pg (ref 26.0–34.0)
MCHC: 32.7 g/dL (ref 30.0–36.0)
MCV: 96 fL (ref 80.0–100.0)
Platelets: 198 10*3/uL (ref 150–400)
RBC: 3.22 MIL/uL — ABNORMAL LOW (ref 4.22–5.81)
RDW: 12.4 % (ref 11.5–15.5)
WBC: 8.5 10*3/uL (ref 4.0–10.5)
nRBC: 0 % (ref 0.0–0.2)

## 2024-03-30 LAB — SURGICAL PCR SCREEN
MRSA, PCR: NEGATIVE
Staphylococcus aureus: NEGATIVE

## 2024-03-30 LAB — CREATININE, SERUM
Creatinine, Ser: 0.78 mg/dL (ref 0.61–1.24)
GFR, Estimated: >60 mL/min (ref >60)

## 2024-03-30 SURGERY — OPEN REDUCTION INTERNAL FIXATION (ORIF) TIBIAL PLATEAU
Anesthesia: General | Site: Leg Lower | Laterality: Left

## 2024-03-30 MED ORDER — DEXAMETHASONE SODIUM PHOSPHATE 10 MG/ML IJ SOLN
INTRAMUSCULAR | Status: DC | PRN
  Administered 2024-03-30: 10 mg via INTRAVENOUS

## 2024-03-30 MED ORDER — VANCOMYCIN HCL 1000 MG IV SOLR
INTRAVENOUS | Status: AC
  Filled 2024-03-30: qty 20

## 2024-03-30 MED ORDER — POVIDONE-IODINE 10 % EX SWAB: 2.0000 | Freq: Once | CUTANEOUS | Status: DC

## 2024-03-30 MED ORDER — LACTATED RINGERS IV SOLN: INTRAVENOUS | Status: DC

## 2024-03-30 MED ORDER — ENOXAPARIN SODIUM 40 MG/0.4ML IJ SOSY
40.0000 mg | PREFILLED_SYRINGE | INTRAMUSCULAR | Status: DC
  Administered 2024-03-31: 40 mg via SUBCUTANEOUS
  Filled 2024-03-30 (×2): qty 0.4

## 2024-03-30 MED ORDER — DIPHENHYDRAMINE HCL 12.5 MG/5ML PO ELIX
12.5000 mg | ORAL_SOLUTION | ORAL | Status: DC | PRN
  Filled 2024-03-30: qty 10

## 2024-03-30 MED ORDER — HYDROMORPHONE HCL 1 MG/ML IJ SOLN
0.2500 mg | INTRAMUSCULAR | Status: DC | PRN
  Administered 2024-03-30: 0.5 mg via INTRAVENOUS

## 2024-03-30 MED ORDER — TRANEXAMIC ACID-NACL 1000-0.7 MG/100ML-% IV SOLN
1000.0000 mg | INTRAVENOUS | Status: AC
  Administered 2024-03-30: 1000 mg via INTRAVENOUS

## 2024-03-30 MED ORDER — PROPOFOL 10 MG/ML IV BOLUS
INTRAVENOUS | Status: AC
  Filled 2024-03-30: qty 20

## 2024-03-30 MED ORDER — DEXAMETHASONE SODIUM PHOSPHATE 10 MG/ML IJ SOLN
INTRAMUSCULAR | Status: AC
  Filled 2024-03-30: qty 1

## 2024-03-30 MED ORDER — METHOCARBAMOL 500 MG PO TABS: 500.0000 mg | ORAL_TABLET | Freq: Four times a day (QID) | ORAL | Status: DC | PRN

## 2024-03-30 MED ORDER — FENTANYL CITRATE (PF) 250 MCG/5ML IJ SOLN
INTRAMUSCULAR | Status: DC | PRN
  Administered 2024-03-30 (×3): 50 ug via INTRAVENOUS

## 2024-03-30 MED ORDER — CEFAZOLIN SODIUM-DEXTROSE 2-4 GM/100ML-% IV SOLN
2.0000 g | Freq: Three times a day (TID) | INTRAVENOUS | Status: AC
  Administered 2024-03-30 – 2024-03-31 (×3): 2 g via INTRAVENOUS
  Filled 2024-03-30 (×3): qty 100

## 2024-03-30 MED ORDER — OXYCODONE HCL 5 MG/5ML PO SOLN: 5.0000 mg | Freq: Once | ORAL | Status: DC | PRN

## 2024-03-30 MED ORDER — OXYCODONE HCL 5 MG PO TABS: 5.0000 mg | ORAL_TABLET | Freq: Once | ORAL | Status: DC | PRN

## 2024-03-30 MED ORDER — MIDAZOLAM HCL 2 MG/2ML IJ SOLN
INTRAMUSCULAR | Status: DC | PRN
  Administered 2024-03-30: 2 mg via INTRAVENOUS

## 2024-03-30 MED ORDER — PHENYLEPHRINE HCL-NACL 20-0.9 MG/250ML-% IV SOLN
INTRAVENOUS | Status: DC | PRN
  Administered 2024-03-30: 25 ug/min via INTRAVENOUS

## 2024-03-30 MED ORDER — METOCLOPRAMIDE HCL 5 MG PO TABS: 5.0000 mg | ORAL_TABLET | Freq: Three times a day (TID) | ORAL | Status: DC | PRN

## 2024-03-30 MED ORDER — ONDANSETRON HCL 4 MG/2ML IJ SOLN
INTRAMUSCULAR | Status: AC
  Filled 2024-03-30: qty 2

## 2024-03-30 MED ORDER — LIDOCAINE 2% (20 MG/ML) 5 ML SYRINGE
INTRAMUSCULAR | Status: AC
  Filled 2024-03-30: qty 5

## 2024-03-30 MED ORDER — METHOCARBAMOL 1000 MG/10ML IJ SOLN: 500.0000 mg | Freq: Four times a day (QID) | INTRAMUSCULAR | Status: DC | PRN

## 2024-03-30 MED ORDER — DEXMEDETOMIDINE HCL IN NACL 80 MCG/20ML IV SOLN
INTRAVENOUS | Status: DC | PRN
  Administered 2024-03-30: 8 ug via INTRAVENOUS

## 2024-03-30 MED ORDER — HYDROMORPHONE HCL 1 MG/ML IJ SOLN
INTRAMUSCULAR | Status: AC
  Filled 2024-03-30: qty 1

## 2024-03-30 MED ORDER — TRANEXAMIC ACID-NACL 1000-0.7 MG/100ML-% IV SOLN
INTRAVENOUS | Status: AC
  Filled 2024-03-30: qty 100

## 2024-03-30 MED ORDER — CHLORHEXIDINE GLUCONATE 0.12 % MT SOLN
OROMUCOSAL | Status: AC
  Administered 2024-03-30: 15 mL via OROMUCOSAL
  Filled 2024-03-30: qty 15

## 2024-03-30 MED ORDER — 0.9 % SODIUM CHLORIDE (POUR BTL) OPTIME
TOPICAL | Status: DC | PRN
  Administered 2024-03-30: 1000 mL

## 2024-03-30 MED ORDER — CHLORHEXIDINE GLUCONATE 4 % EX SOLN: 60.0000 mL | Freq: Once | CUTANEOUS | Status: DC

## 2024-03-30 MED ORDER — ORAL CARE MOUTH RINSE: 15.0000 mL | Freq: Once | OROMUCOSAL | Status: AC

## 2024-03-30 MED ORDER — DOCUSATE SODIUM 100 MG PO CAPS
100.0000 mg | ORAL_CAPSULE | Freq: Two times a day (BID) | ORAL | Status: DC
  Administered 2024-03-30 – 2024-04-01 (×3): 100 mg via ORAL
  Filled 2024-03-30 (×5): qty 1

## 2024-03-30 MED ORDER — CHLORHEXIDINE GLUCONATE 0.12 % MT SOLN: 15.0000 mL | Freq: Once | OROMUCOSAL | Status: AC

## 2024-03-30 MED ORDER — KETOROLAC TROMETHAMINE 15 MG/ML IJ SOLN
15.0000 mg | Freq: Four times a day (QID) | INTRAMUSCULAR | Status: AC
  Administered 2024-03-30 – 2024-03-31 (×4): 15 mg via INTRAVENOUS
  Filled 2024-03-30 (×4): qty 1

## 2024-03-30 MED ORDER — CEFAZOLIN SODIUM-DEXTROSE 2-4 GM/100ML-% IV SOLN
2.0000 g | INTRAVENOUS | Status: AC
  Administered 2024-03-30: 2 g via INTRAVENOUS

## 2024-03-30 MED ORDER — CEFAZOLIN SODIUM-DEXTROSE 2-4 GM/100ML-% IV SOLN
INTRAVENOUS | Status: AC
  Filled 2024-03-30: qty 100

## 2024-03-30 MED ORDER — LIDOCAINE 2% (20 MG/ML) 5 ML SYRINGE
INTRAMUSCULAR | Status: DC | PRN
  Administered 2024-03-30: 80 mg via INTRAVENOUS

## 2024-03-30 MED ORDER — PROPOFOL 10 MG/ML IV BOLUS
INTRAVENOUS | Status: DC | PRN
  Administered 2024-03-30: 100 mg via INTRAVENOUS
  Administered 2024-03-30: 200 mg via INTRAVENOUS

## 2024-03-30 MED ORDER — METOCLOPRAMIDE HCL 5 MG/ML IJ SOLN: 5.0000 mg | Freq: Three times a day (TID) | INTRAMUSCULAR | Status: DC | PRN

## 2024-03-30 MED ORDER — KETOROLAC TROMETHAMINE 30 MG/ML IJ SOLN
INTRAMUSCULAR | Status: AC
  Filled 2024-03-30: qty 1

## 2024-03-30 MED ORDER — KETOROLAC TROMETHAMINE 30 MG/ML IJ SOLN
30.0000 mg | Freq: Once | INTRAMUSCULAR | Status: AC | PRN
  Administered 2024-03-30: 30 mg via INTRAVENOUS

## 2024-03-30 MED ORDER — PHENYLEPHRINE 80 MCG/ML (10ML) SYRINGE FOR IV PUSH (FOR BLOOD PRESSURE SUPPORT)
PREFILLED_SYRINGE | INTRAVENOUS | Status: DC | PRN
  Administered 2024-03-30: 160 ug via INTRAVENOUS
  Administered 2024-03-30 (×4): 80 ug via INTRAVENOUS

## 2024-03-30 MED ORDER — TOBRAMYCIN SULFATE 1.2 G IJ SOLR
INTRAMUSCULAR | Status: AC
  Filled 2024-03-30: qty 1.2

## 2024-03-30 MED ORDER — SODIUM CHLORIDE 0.9 % IV SOLN: INTRAVENOUS | Status: AC

## 2024-03-30 MED ORDER — ONDANSETRON HCL 4 MG/2ML IJ SOLN
4.0000 mg | Freq: Once | INTRAMUSCULAR | Status: DC | PRN
Start: 2024-03-30 — End: 2024-03-30

## 2024-03-30 MED ORDER — VANCOMYCIN HCL 1000 MG IV SOLR
INTRAVENOUS | Status: DC | PRN
Start: 2024-03-30 — End: 2024-03-30
  Administered 2024-03-30: 1000 mg

## 2024-03-30 MED ORDER — FENTANYL CITRATE (PF) 250 MCG/5ML IJ SOLN
INTRAMUSCULAR | Status: AC
Start: 2024-03-30 — End: ?
  Filled 2024-03-30: qty 5

## 2024-03-30 MED ORDER — MIDAZOLAM HCL 2 MG/2ML IJ SOLN
INTRAMUSCULAR | Status: AC
Start: 2024-03-30 — End: ?
  Filled 2024-03-30: qty 2

## 2024-03-30 SURGICAL SUPPLY — 48 items
BAG COUNTER SPONGE SURGICOUNT (BAG) ×1 IMPLANT
BIT DRILL CALIBR QC 2.8X250 (BIT) IMPLANT
BIT DRILL QC SFS 2.5X170 (BIT) IMPLANT
BLADE CLIPPER SURG (BLADE) IMPLANT
BLADE SURG 15 STRL LF DISP TIS (BLADE) ×1 IMPLANT
BNDG ELASTIC 4INX 5YD STR LF (GAUZE/BANDAGES/DRESSINGS) IMPLANT
BNDG ELASTIC 6INX 5YD STR LF (GAUZE/BANDAGES/DRESSINGS) ×1 IMPLANT
BRUSH SCRUB EZ PLAIN DRY (MISCELLANEOUS) ×2 IMPLANT
CANISTER SUCTION 3000ML PPV (SUCTIONS) ×1 IMPLANT
CHLORAPREP W/TINT 26 (MISCELLANEOUS) ×2 IMPLANT
COVER SURGICAL LIGHT HANDLE (MISCELLANEOUS) ×1 IMPLANT
CUFF TRNQT CYL 34X4.125X (TOURNIQUET CUFF) ×1 IMPLANT
DRAPE C-ARM 42X72 X-RAY (DRAPES) ×1 IMPLANT
DRAPE C-ARMOR (DRAPES) ×1 IMPLANT
DRAPE SURG ORHT 6 SPLT 77X108 (DRAPES) ×2 IMPLANT
DRAPE U-SHAPE 47X51 STRL (DRAPES) ×1 IMPLANT
DRSG MEPITEL 4X7.2 (GAUZE/BANDAGES/DRESSINGS) IMPLANT
ELECTRODE REM PT RTRN 9FT ADLT (ELECTROSURGICAL) ×1 IMPLANT
GAUZE SPONGE 4X4 12PLY STRL (GAUZE/BANDAGES/DRESSINGS) ×1 IMPLANT
GLOVE BIO SURGEON STRL SZ 6.5 (GLOVE) ×3 IMPLANT
GLOVE BIO SURGEON STRL SZ7.5 (GLOVE) ×4 IMPLANT
GLOVE BIOGEL PI IND STRL 6.5 (GLOVE) ×1 IMPLANT
GLOVE BIOGEL PI IND STRL 7.5 (GLOVE) ×1 IMPLANT
GOWN STRL REUS W/ TWL LRG LVL3 (GOWN DISPOSABLE) ×2 IMPLANT
KIT BASIN OR (CUSTOM PROCEDURE TRAY) ×1 IMPLANT
KIT TURNOVER KIT B (KITS) ×1 IMPLANT
KWIRE 1.6X150 (WIRE) IMPLANT
NDL SUT 6 .5 CRC .975X.05 MAYO (NEEDLE) ×1 IMPLANT
NS IRRIG 1000ML POUR BTL (IV SOLUTION) ×1 IMPLANT
PACK TOTAL JOINT (CUSTOM PROCEDURE TRAY) ×1 IMPLANT
PAD ARMBOARD POSITIONER FOAM (MISCELLANEOUS) ×2 IMPLANT
PADDING CAST ABS COTTON 4X4 ST (CAST SUPPLIES) IMPLANT
PADDING CAST COTTON 6X4 STRL (CAST SUPPLIES) ×1 IMPLANT
PLATE PROX TIBIA LEFT (Plate) IMPLANT
SCREW CORTEX 3.5 50MM (Screw) IMPLANT
SCREW CORTEX 3.5X80MM (Screw) IMPLANT
SCREW LOCK CORT ST 3.5X34 (Screw) IMPLANT
SCREW LOCK CORT ST 3.5X38 (Screw) IMPLANT
SCREW LOCKING 3.5X70MM VA (Screw) IMPLANT
SCREW LOCKING 3.5X80MM VA (Screw) IMPLANT
SCREW LOCKING VA 3.5X75MM (Screw) IMPLANT
STAPLER SKIN PROX 35W (STAPLE) ×1 IMPLANT
SUCTION TUBE FRAZIER 10FR DISP (SUCTIONS) ×1 IMPLANT
SUT ETHILON 3 0 PS 1 (SUTURE) IMPLANT
SUT MON AB 2-0 SH27 (SUTURE) IMPLANT
SUT VIC AB 0 CT1 18XCR BRD 8 (SUTURE) IMPLANT
TOWEL GREEN STERILE (TOWEL DISPOSABLE) ×2 IMPLANT
WATER STERILE IRR 1000ML POUR (IV SOLUTION) ×2 IMPLANT
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
## 2024-03-30 NOTE — Anesthesia Preprocedure Evaluation (Addendum)
 Anesthesia Evaluation  Patient identified by MRN, date of birth, ID band Patient awake    Reviewed: Allergy & Precautions, NPO status , Patient's Chart, lab work & pertinent test results  Airway Mallampati: II  TM Distance: >3 FB Neck ROM: Full    Dental no notable dental hx. (+) Teeth Intact   Pulmonary neg pulmonary ROS, Patient abstained from smoking.   Pulmonary exam normal breath sounds clear to auscultation       Cardiovascular Exercise Tolerance: Good Normal cardiovascular exam Rhythm:Regular Rate:Normal     Neuro/Psych  PSYCHIATRIC DISORDERS    Schizophrenia     GI/Hepatic negative GI ROS, Neg liver ROS,,,  Endo/Other    Renal/GU negative Renal ROS  negative genitourinary   Musculoskeletal negative musculoskeletal ROS (+)    Abdominal   Peds  Hematology Lab Results      Component                Value               Date                      WBC                      9.8                 03/28/2024                HGB                      13.7                03/28/2024                HCT                      41.7                03/28/2024                MCV                      94.8                03/28/2024                PLT                      278                 03/28/2024              Anesthesia Other Findings   Reproductive/Obstetrics                             Anesthesia Physical Anesthesia Plan  ASA: 2  Anesthesia Plan: General   Post-op Pain Management: Precedex  and Ofirmev  IV (intra-op)*   Induction: Intravenous  PONV Risk Score and Plan: 3 and Treatment may vary due to age or medical condition, Ondansetron  and Dexamethasone   Airway Management Planned: LMA  Additional Equipment: None  Intra-op Plan:   Post-operative Plan: Extubation in OR  Informed Consent: I have reviewed the patients History and Physical, chart, labs and discussed the procedure including  the risks, benefits and alternatives for the proposed anesthesia with the patient  or authorized representative who has indicated his/her understanding and acceptance.     Dental advisory given  Plan Discussed with: CRNA and Surgeon  Anesthesia Plan Comments:         Anesthesia Quick Evaluation
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
## 2024-03-30 NOTE — Transfer of Care (Addendum)
 Immediate Anesthesia Transfer of Care Note  Patient: William Mayer  Procedure(s) Performed: OPEN REDUCTION INTERNAL FIXATION (ORIF) TIBIAL PLATEAU (Left: Leg Lower)  Patient Location: PACU  Anesthesia Type:General  Level of Consciousness: awake, alert , patient cooperative, and responds to stimulation  Airway & Oxygen Therapy: Patient Spontanous Breathing and Patient connected to face mask oxygen  Post-op Assessment: Report given to RN, Post -op Vital signs reviewed and stable, and Patient moving all extremities X 4  Post vital signs: Reviewed and stable  Last Vitals:  Vitals Value Taken Time  BP 155/98 03/30/24 1400  Temp 37 C 03/30/24 1349  Pulse 79 03/30/24 1402  Resp 14 03/30/24 1403  SpO2 90 % 03/30/24 1402  Vitals shown include unfiled device data.  Last Pain:  Vitals:   03/30/24 1111  TempSrc: Axillary  PainSc:          Complications: No notable events documented.
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
## 2024-03-30 NOTE — Interval H&P Note (Signed)
 History and Physical Interval Note:  03/30/2024 12:03 PM  William Mayer  has presented today for surgery, with the diagnosis of Left tibial plateau fracture.  The various methods of treatment have been discussed with the patient and family. After consideration of risks, benefits and other options for treatment, the patient has consented to  Procedure(s): OPEN REDUCTION INTERNAL FIXATION (ORIF) TIBIAL PLATEAU (Left) as a surgical intervention.  The patient's history has been reviewed, patient examined, no change in status, stable for surgery.  I have reviewed the patient's chart and labs.  Questions were answered to the patient's satisfaction.     Lochlin Eppinger P Clifford Benninger

## 2024-03-30 NOTE — Anesthesia Procedure Notes (Signed)
 Procedure Name: LMA Insertion Date/Time: 03/30/2024 12:32 PM  Performed by: Dawna Etienne, CRNAPre-anesthesia Checklist: Patient identified, Emergency Drugs available, Suction available and Patient being monitored Patient Re-evaluated:Patient Re-evaluated prior to induction Oxygen Delivery Method: Circle system utilized Preoxygenation: Pre-oxygenation with 100% oxygen Induction Type: IV induction Ventilation: Mask ventilation without difficulty LMA: LMA with gastric port inserted LMA Size: 4.0 Number of attempts: 1 Placement Confirmation: positive ETCO2 and breath sounds checked- equal and bilateral Tube secured with: Tape Dental Injury: Teeth and Oropharynx as per pre-operative assessment

## 2024-03-30 NOTE — Progress Notes (Signed)
   Trauma/Critical Care Follow Up Note  Subjective:    Overnight Issues:   Objective:  Vital signs for last 24 hours: Temp:  [97.1 F (36.2 C)-98.6 F (37 C)] 97.1 F (36.2 C) (06/02 1111) Pulse Rate:  [63-77] 63 (06/02 1111) Resp:  [15-16] 15 (06/02 1111) BP: (126-145)/(73-82) 142/82 (06/02 1111) SpO2:  [99 %-100 %] 100 % (06/02 1111)  Hemodynamic parameters for last 24 hours:    Intake/Output from previous day: 06/01 0701 - 06/02 0700 In: 1200 [P.O.:1200] Out: 1500 [Urine:1500]  Intake/Output this shift: No intake/output data recorded.  Vent settings for last 24 hours:    Physical Exam:  Gen: comfortable, no distress Neuro: follows commands, alert, communicative HEENT: PERRL Neck: supple CV: RRR Pulm: unlabored breathing on RA Extr: wwp, no edema  Results for orders placed or performed during the hospital encounter of 03/27/24 (from the past 24 hours)  Surgical pcr screen     Status: None   Collection Time: 03/30/24  7:13 AM   Specimen: Nasal Mucosa; Nasal Swab  Result Value Ref Range   MRSA, PCR NEGATIVE NEGATIVE   Staphylococcus aureus NEGATIVE NEGATIVE    Assessment & Plan:  Present on Admission:  Tibial plateau fracture    LOS: 2 days   Additional comments:I reviewed the patient's new clinical lab test results.   and I reviewed the patients new imaging test results.    PHBC  Multiple facial abrasions and contusions, small right neck laceration - local care. Appreciate WOCN recs.  Left tibial plateau and fibular head fracture - Per Dr. Charol Copas. NWB LLE. OR today with Dr. Curtiss Dowdy Hx Schizophrenia - consult Psychiatry, restarted abilify  Etoh use - CIWA FEN - Reg, npo midnight, okay for reg diet post-op  VTE - SCDs, Lovenox  ID - None Foley - None, spont void Plan - surgery, therapies, refused to answer questions regarding pain control, diet, etc, will see if he is more cooperative later.  Anda Bamberg, MD Trauma & General Surgery Please  use [email or web address] to contact on call provider  03/30/2024  *Care during the described time interval was provided by me. I have reviewed this patient's available data, including medical history, events of note, physical examination and test results as part of my evaluation.

## 2024-03-30 NOTE — Plan of Care (Signed)
  Problem: Education: Goal: Knowledge of General Education information will improve Description: Including pain rating scale, medication(s)/side effects and non-pharmacologic comfort measures Outcome: Not Progressing   Problem: Health Behavior/Discharge Planning: Goal: Ability to manage health-related needs will improve Outcome: Not Progressing   Problem: Activity: Goal: Risk for activity intolerance will decrease Outcome: Not Progressing   Problem: Clinical Measurements: Goal: Ability to maintain clinical measurements within normal limits will improve Outcome: Progressing Goal: Will remain free from infection Outcome: Progressing Goal: Respiratory complications will improve Outcome: Progressing Goal: Cardiovascular complication will be avoided Outcome: Progressing

## 2024-03-30 NOTE — TOC CAGE-AID Note (Signed)
 Transition of Care [facility]) - CAGE-AID Screening   Patient Details  Name: Maddox Hlavaty MRN: [id number] Date of Birth: 08-Jan-1991  [facility]) CM/SW Contact:    Lajune Perine E Tamsen Reist, LCSW Phone Number: 03/30/2024, 3:50 PM   Clinical Narrative:    CAGE-AID Screening: Substance Abuse Screening unable to be completed due to: : Patient Refused

## 2024-03-31 ENCOUNTER — Encounter (HOSPITAL_COMMUNITY): Payer: Self-pay | Admitting: Student

## 2024-03-31 LAB — CBC
HCT: 28.6 % — ABNORMAL LOW (ref 39.0–52.0)
Hemoglobin: 9.5 g/dL — ABNORMAL LOW (ref 13.0–17.0)
MCH: 31.7 pg (ref 26.0–34.0)
MCHC: 33.2 g/dL (ref 30.0–36.0)
MCV: 95.3 fL (ref 80.0–100.0)
Platelets: 223 10*3/uL (ref 150–400)
RBC: 3 MIL/uL — ABNORMAL LOW (ref 4.22–5.81)
RDW: 12.3 % (ref 11.5–15.5)
WBC: 10.6 10*3/uL — ABNORMAL HIGH (ref 4.0–10.5)
nRBC: 0 % (ref 0.0–0.2)

## 2024-03-31 LAB — BASIC METABOLIC PANEL WITH GFR
Anion gap: 13 (ref 5–15)
BUN: 12 mg/dL (ref 6–20)
CO2: 24 mmol/L (ref 22–32)
Calcium: 9 mg/dL (ref 8.9–10.3)
Chloride: 99 mmol/L (ref 98–111)
Creatinine, Ser: 0.8 mg/dL (ref 0.61–1.24)
GFR, Estimated: >60 mL/min (ref >60)
Glucose, Bld: 133 mg/dL — ABNORMAL HIGH (ref 70–99)
Potassium: 3.8 mmol/L (ref 3.5–5.1)
Sodium: 136 mmol/L (ref 135–145)

## 2024-03-31 MED ORDER — VITAMIN D 25 MCG (1000 UNIT) PO TABS
1000.0000 [IU] | ORAL_TABLET | Freq: Every day | ORAL | Status: DC
  Administered 2024-03-31 – 2024-04-01 (×2): 1000 [IU] via ORAL
  Filled 2024-03-31 (×2): qty 1

## 2024-03-31 MED ORDER — BISACODYL 5 MG PO TBEC
10.0000 mg | DELAYED_RELEASE_TABLET | Freq: Once | ORAL | Status: AC
  Administered 2024-03-31: 10 mg via ORAL
  Filled 2024-03-31: qty 2

## 2024-03-31 MED ORDER — ARIPIPRAZOLE ER 400 MG IM SRER
400.0000 mg | INTRAMUSCULAR | Status: DC
  Administered 2024-03-31: 400 mg via INTRAMUSCULAR
  Filled 2024-03-31: qty 2

## 2024-03-31 MED ORDER — METHOCARBAMOL 500 MG PO TABS: 500.0000 mg | ORAL_TABLET | Freq: Four times a day (QID) | ORAL | Status: DC

## 2024-03-31 MED ORDER — HYDROMORPHONE HCL 1 MG/ML IJ SOLN
0.2500 mg | Freq: Three times a day (TID) | INTRAMUSCULAR | Status: DC | PRN
  Filled 2024-03-31: qty 0.5

## 2024-03-31 MED ORDER — ARIPIPRAZOLE 10 MG PO TABS
10.0000 mg | ORAL_TABLET | Freq: Every day | ORAL | Status: DC
  Administered 2024-04-01: 10 mg via ORAL
  Filled 2024-03-31: qty 1

## 2024-03-31 MED ORDER — POLYETHYLENE GLYCOL 3350 17 G PO PACK
17.0000 g | PACK | Freq: Two times a day (BID) | ORAL | Status: DC
  Administered 2024-03-31 – 2024-04-01 (×2): 17 g via ORAL
  Filled 2024-03-31 (×3): qty 1

## 2024-03-31 MED ORDER — METHOCARBAMOL 750 MG PO TABS
750.0000 mg | ORAL_TABLET | Freq: Four times a day (QID) | ORAL | Status: DC
  Administered 2024-03-31 – 2024-04-01 (×4): 750 mg via ORAL
  Filled 2024-03-31 (×5): qty 1

## 2024-03-31 MED ORDER — SENNA 8.6 MG PO TABS
2.0000 | ORAL_TABLET | Freq: Once | ORAL | Status: AC
  Administered 2024-03-31: 17.2 mg via ORAL
  Filled 2024-03-31: qty 2

## 2024-03-31 NOTE — Progress Notes (Signed)
 Patient has refused oral medications and vital signs for tonight stating he is sleeping.
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
## 2024-03-31 NOTE — TOC Progression Note (Signed)
 Transition of Care [facility]) - Progression Note    Patient Details  Name: Stedman [place] MRN: [id number] Date of Birth: 01-Jul-1991  Transition of Care [facility]) CM/SW Contact  Coral Timme E Zora Glendenning, LCSW Phone Number: 03/31/2024, 10:41 AM  Clinical Narrative:    CSW went to bedside per patient's mother's request. Patient states his mother left. Called and left VM for mother, [place]. Received update from Financial Counseling who states [employer] will follow up with patient/family today for Medicaid screen.   Expected Discharge Plan: Home/Self Care Barriers to Discharge: Continued Medical Work up  Expected Discharge Plan and Services       Living arrangements for the past 2 months: Single Family Home                                       Social Determinants of Health (SDOH) Interventions SDOH Screenings   Food Insecurity: Patient Unable To Answer (03/28/2024)  Housing: Patient Unable To Answer (03/28/2024)  Transportation Needs: Patient Unable To Answer (03/28/2024)  Utilities: Patient Unable To Answer (03/28/2024)  Tobacco Use: Low Risk  (03/30/2024)    Readmission Risk Interventions    03/30/2024    3:46 PM  Readmission Risk Prevention Plan  Post Dischage Appt Complete  Medication Screening Complete  Transportation Screening Complete

## 2024-03-31 NOTE — Anesthesia Postprocedure Evaluation (Signed)
 Anesthesia Post Note  Patient: William Mayer  Procedure(s) Performed: OPEN REDUCTION INTERNAL FIXATION (ORIF) TIBIAL PLATEAU (Left: Leg Lower)     Anesthesia Type: General Anesthetic complications: no   No notable events documented.  Last Vitals:  Vitals:   03/31/24 0515 03/31/24 0757  BP: 131/83 (!) 149/88  Pulse: 78 72  Resp: 16 20  Temp: 37.1 C 36.8 C  SpO2: 100% 100%    Last Pain:  Vitals:   03/31/24 0750  TempSrc:   PainSc: Asleep                 Rosalita Combe

## 2024-03-31 NOTE — Plan of Care (Signed)
  Problem: Safety: Goal: Ability to remain free from injury will improve Outcome: Not Progressing   Problem: Skin Integrity: Goal: Risk for impaired skin integrity will decrease Outcome: Not Progressing   Problem: Pain Managment: Goal: General experience of comfort will improve and/or be controlled Outcome: Not Progressing   Problem: Coping: Goal: Level of anxiety will decrease Outcome: Not Progressing
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
## 2024-04-01 ENCOUNTER — Encounter (HOSPITAL_COMMUNITY): Payer: Self-pay

## 2024-04-01 ENCOUNTER — Other Ambulatory Visit (HOSPITAL_COMMUNITY): Payer: Self-pay

## 2024-04-01 MED ORDER — OXYCODONE-ACETAMINOPHEN 5-325 MG PO TABS
1.0000 | ORAL_TABLET | ORAL | 0 refills | Status: DC | PRN
  Filled 2024-04-01: qty 42, 7d supply, fill #0

## 2024-04-01 MED ORDER — VITAMIN D3 25 MCG PO TABS
1000.0000 [IU] | ORAL_TABLET | Freq: Every day | ORAL | 0 refills | Status: AC
  Filled 2024-04-01: qty 90, 90d supply, fill #0

## 2024-04-01 MED ORDER — ASPIRIN 81 MG PO TBEC
81.0000 mg | DELAYED_RELEASE_TABLET | Freq: Every day | ORAL | 0 refills | Status: DC
  Filled 2024-04-01: qty 30, 30d supply, fill #0

## 2024-04-01 MED ORDER — METHOCARBAMOL 750 MG PO TABS
750.0000 mg | ORAL_TABLET | Freq: Four times a day (QID) | ORAL | 0 refills | Status: AC
  Filled 2024-04-01: qty 40, 10d supply, fill #0

## 2024-04-01 MED ORDER — ARIPIPRAZOLE 10 MG PO TABS
10.0000 mg | ORAL_TABLET | Freq: Every day | ORAL | 0 refills | Status: DC
  Filled 2024-04-01: qty 30, 30d supply, fill #0
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
## 2024-04-01 NOTE — Plan of Care (Signed)
  Problem: Activity: Goal: Risk for activity intolerance will decrease Outcome: Not Progressing   Problem: Coping: Goal: Level of anxiety will decrease Outcome: Not Progressing   Problem: Pain Managment: Goal: General experience of comfort will improve and/or be controlled Outcome: Not Progressing   Problem: Safety: Goal: Ability to remain free from injury will improve Outcome: Not Progressing
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
## 2024-04-01 NOTE — Progress Notes (Signed)
 Patient still refusing to have vital signs taken and for this nurse to do dressing change. Phlebotomist attempted several times to wake patient to do blood draw but patient got mad that he was woken up. No blood draw was done.
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
## 2024-04-05 ENCOUNTER — Emergency Department (HOSPITAL_COMMUNITY)
Admission: EM | Admit: 2024-04-05 | Discharge: 2024-04-06 | Discharge status: Against Medical Advice | Payer: Self-pay | Attending: Emergency Medicine | Admitting: Emergency Medicine

## 2024-04-05 ENCOUNTER — Emergency Department (HOSPITAL_COMMUNITY): Payer: Self-pay

## 2024-04-05 ENCOUNTER — Encounter (HOSPITAL_COMMUNITY): Payer: Self-pay | Admitting: Emergency Medicine

## 2024-04-05 DIAGNOSIS — R451 Restlessness and agitation: Secondary | ICD-10-CM | POA: Insufficient documentation

## 2024-04-05 DIAGNOSIS — Z5321 Procedure and treatment not carried out due to patient leaving prior to being seen by health care provider: Secondary | ICD-10-CM | POA: Insufficient documentation

## 2024-04-05 DIAGNOSIS — M79605 Pain in left leg: Secondary | ICD-10-CM | POA: Insufficient documentation
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
## 2024-04-05 NOTE — ED Notes (Signed)
Pt name called for updated vitals, no response 

## 2024-04-06 NOTE — ED Notes (Signed)
 Dr Kermit Ped in [place] was advised and apparently everything was "old" - no reason to call and ask him to return.

## 2024-04-13 ENCOUNTER — Other Ambulatory Visit: Payer: Self-pay

## 2024-04-13 ENCOUNTER — Emergency Department (HOSPITAL_COMMUNITY)
Admission: EM | Admit: 2024-04-13 | Discharge: 2024-04-13 | Disposition: A | Discharge status: Home / Self Care | Payer: MEDICAID | Attending: Emergency Medicine | Admitting: Emergency Medicine

## 2024-04-13 ENCOUNTER — Emergency Department (HOSPITAL_COMMUNITY): Payer: MEDICAID

## 2024-04-13 ENCOUNTER — Encounter (HOSPITAL_COMMUNITY): Payer: Self-pay

## 2024-04-13 DIAGNOSIS — M79605 Pain in left leg: Secondary | ICD-10-CM | POA: Diagnosis present

## 2024-04-13 DIAGNOSIS — Z7982 Long term (current) use of aspirin: Secondary | ICD-10-CM | POA: Diagnosis not present

## 2024-04-13 DIAGNOSIS — M7989 Other specified soft tissue disorders: Secondary | ICD-10-CM

## 2024-04-13 DIAGNOSIS — R600 Localized edema: Secondary | ICD-10-CM | POA: Insufficient documentation

## 2024-04-13 MED ORDER — ENOXAPARIN SODIUM 100 MG/ML IJ SOSY
100.0000 mg | PREFILLED_SYRINGE | Freq: Once | INTRAMUSCULAR | Status: AC
  Filled 2024-04-13: qty 1
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
## 2024-04-13 NOTE — ED Notes (Signed)
 Patient wants a sandwich. Patient told he needs to have scans and see the doctor before he can have one.

## 2024-04-13 NOTE — ED Notes (Signed)
 Attempted to get patient to acknowledge his DC and ignored this Clinical research associate. Asked several times if he needed anything before leaving.  Called security for assistance getting patient out.
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
## 2024-04-13 NOTE — Discharge Instructions (Addendum)
 Thank for letting us  evaluate you today.  Your left leg is very swollen.  Unfortunately, we do not have ultrasound here today.  Please call to make an appointment tomorrow for ultrasound to rule out DVT.

## 2024-04-14 ENCOUNTER — Ambulatory Visit (HOSPITAL_COMMUNITY)
Admission: RE | Admit: 2024-04-14 | Discharge: 2024-04-14 | Disposition: A | Discharge status: Home / Self Care | Payer: MEDICAID | Source: Ambulatory Visit | Attending: Emergency Medicine | Admitting: Emergency Medicine

## 2024-04-14 DIAGNOSIS — M7989 Other specified soft tissue disorders: Secondary | ICD-10-CM | POA: Diagnosis present

## 2024-04-14 DIAGNOSIS — M79662 Pain in left lower leg: Secondary | ICD-10-CM | POA: Diagnosis not present
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
## 2024-04-29 ENCOUNTER — Other Ambulatory Visit (HOSPITAL_COMMUNITY): Payer: Self-pay

## 2024-05-05 ENCOUNTER — Encounter: Payer: Self-pay | Admitting: Family Medicine

## 2024-05-05 ENCOUNTER — Ambulatory Visit (INDEPENDENT_AMBULATORY_CARE_PROVIDER_SITE_OTHER): Payer: MEDICAID | Admitting: Family Medicine

## 2024-05-05 VITALS — BP 128/74 | HR 60 | Temp 98.2°F | Ht 65.0 in | Wt 123.0 lb

## 2024-05-05 DIAGNOSIS — Z7689 Persons encountering health services in other specified circumstances: Secondary | ICD-10-CM

## 2024-05-05 DIAGNOSIS — F1994 Other psychoactive substance use, unspecified with psychoactive substance-induced mood disorder: Secondary | ICD-10-CM | POA: Diagnosis not present

## 2024-05-05 DIAGNOSIS — F39 Unspecified mood [affective] disorder: Secondary | ICD-10-CM

## 2024-05-05 DIAGNOSIS — R7989 Other specified abnormal findings of blood chemistry: Secondary | ICD-10-CM

## 2024-05-05 DIAGNOSIS — S82142D Displaced bicondylar fracture of left tibia, subsequent encounter for closed fracture with routine healing: Secondary | ICD-10-CM | POA: Diagnosis not present

## 2024-05-05 DIAGNOSIS — R799 Abnormal finding of blood chemistry, unspecified: Secondary | ICD-10-CM

## 2024-05-05 DIAGNOSIS — Z1329 Encounter for screening for other suspected endocrine disorder: Secondary | ICD-10-CM

## 2024-05-05 LAB — COMPREHENSIVE METABOLIC PANEL WITH GFR
ALT: 10 U/L (ref 0–53)
AST: 14 U/L (ref 0–37)
Albumin: 4.1 g/dL (ref 3.5–5.2)
Alkaline Phosphatase: 151 U/L — ABNORMAL HIGH (ref 39–117)
BUN: 17 mg/dL (ref 6–23)
CO2: 30 meq/L (ref 19–32)
Calcium: 9.6 mg/dL (ref 8.4–10.5)
Chloride: 103 meq/L (ref 96–112)
Creatinine, Ser: 0.94 mg/dL (ref 0.40–1.50)
GFR: 105.35 mL/min (ref >60.00)
Glucose, Bld: 94 mg/dL (ref 70–99)
Potassium: 4.1 meq/L (ref 3.5–5.1)
Sodium: 140 meq/L (ref 135–145)
Total Bilirubin: 0.2 mg/dL (ref 0.2–1.2)
Total Protein: 7.9 g/dL (ref 6.0–8.3)

## 2024-05-05 LAB — TSH: TSH: 2.38 u[IU]/mL (ref 0.35–5.50)

## 2024-05-05 LAB — CBC WITH DIFFERENTIAL/PLATELET
Basophils Absolute: 0 K/uL (ref 0.0–0.1)
Basophils Relative: 0.3 % (ref 0.0–3.0)
Eosinophils Absolute: 0.1 K/uL (ref 0.0–0.7)
Eosinophils Relative: 1 % (ref 0.0–5.0)
HCT: 39.1 % (ref 39.0–52.0)
Hemoglobin: 12.7 g/dL — ABNORMAL LOW (ref 13.0–17.0)
Lymphocytes Relative: 39.3 % (ref 12.0–46.0)
Lymphs Abs: 2.2 K/uL (ref 0.7–4.0)
MCHC: 32.4 g/dL (ref 30.0–36.0)
MCV: 93.8 fl (ref 78.0–100.0)
Monocytes Absolute: 0.5 K/uL (ref 0.1–1.0)
Monocytes Relative: 8.5 % (ref 3.0–12.0)
Neutro Abs: 2.8 K/uL (ref 1.4–7.7)
Neutrophils Relative %: 50.9 % (ref 43.0–77.0)
Platelets: 348 K/uL (ref 150.0–400.0)
RBC: 4.17 Mil/uL — ABNORMAL LOW (ref 4.22–5.81)
RDW: 14.4 % (ref 11.5–15.5)
WBC: 5.5 K/uL (ref 4.0–10.5)
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
## 2024-05-06 ENCOUNTER — Ambulatory Visit: Payer: Self-pay | Admitting: Family Medicine

## 2024-05-06 NOTE — Patient Instructions (Addendum)
-  It was nice to meet you and look forward to taking care of you.  -Recommend to follow up with orthopedic and behavioral health. Provided a print of discharge instructions to contact after visit to follow up with specialist. -Will need to follow up with orthopedic if additional pain medication is needed.  -Ordered labs. Office will call with lab results and will be available on MyChart.  -Follow up in 2 weeks.

## 2024-05-11 ENCOUNTER — Ambulatory Visit (INDEPENDENT_AMBULATORY_CARE_PROVIDER_SITE_OTHER): Payer: MEDICAID | Admitting: Licensed Clinical Social Worker

## 2024-05-11 DIAGNOSIS — F39 Unspecified mood [affective] disorder: Secondary | ICD-10-CM
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
## 2024-05-26 ENCOUNTER — Ambulatory Visit (INDEPENDENT_AMBULATORY_CARE_PROVIDER_SITE_OTHER): Payer: MEDICAID | Admitting: Physician Assistant

## 2024-05-26 ENCOUNTER — Encounter (HOSPITAL_COMMUNITY): Payer: Self-pay | Admitting: Physician Assistant

## 2024-05-26 VITALS — BP 121/85 | HR 66 | Temp 97.5°F | Ht 65.0 in | Wt 122.4 lb

## 2024-05-26 DIAGNOSIS — F209 Schizophrenia, unspecified: Secondary | ICD-10-CM | POA: Diagnosis not present

## 2024-05-26 MED ORDER — ARIPIPRAZOLE ER 400 MG IM PRSY: 400.0000 mg | PREFILLED_SYRINGE | INTRAMUSCULAR | 11 refills | Status: AC

## 2024-05-26 MED ORDER — ARIPIPRAZOLE 5 MG PO TABS: 5.0000 mg | ORAL_TABLET | Freq: Every day | ORAL | 0 refills | Status: AC
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
## 2024-05-28 ENCOUNTER — Encounter: Payer: Self-pay | Admitting: Family Medicine

## 2024-05-28 ENCOUNTER — Ambulatory Visit (INDEPENDENT_AMBULATORY_CARE_PROVIDER_SITE_OTHER): Payer: MEDICAID | Admitting: Family Medicine

## 2024-05-28 DIAGNOSIS — J309 Allergic rhinitis, unspecified: Secondary | ICD-10-CM

## 2024-05-28 DIAGNOSIS — J301 Allergic rhinitis due to pollen: Secondary | ICD-10-CM | POA: Diagnosis not present

## 2024-05-28 MED ORDER — FLUTICASONE PROPIONATE 50 MCG/ACT NA SUSP: 2.0000 | Freq: Every day | NASAL | 3 refills | Status: AC | PRN

## 2024-05-28 MED ORDER — CETIRIZINE HCL 10 MG PO TABS: 10.0000 mg | ORAL_TABLET | Freq: Every day | ORAL | 11 refills | Status: AC

## 2024-05-28 NOTE — Progress Notes (Signed)
   Established Patient Office Visit   Subjective:  Patient ID: William Mayer, male    DOB: 04-15-1991  Age: 33 y.o. MRN: [id number]  No chief complaint on file.   HPI Patient is following up for a 2 week follow up from initial visit. He is accompanied with his mother. Since previous appointment, patient has been able to follow up with Orthopedic Specialist Trauma, Lauraine Moores, PA and has appt scheduled on 08/19 with Dr. Franky Haddix. Unable to review records through Epic, but will request records. However, patients mother reports he is suppose to have physical therapy soon, awaiting a phone call. Also, she reports ortho provider has sent pain medication in for patient, but it is not Percocet, and it is at the pharmacy waiting for them to pick it up.   Also, patient has been able to follow up with [facility] for schizophrenia. Patients mother reports next month, he will be switching from Abilify  oral tablets to monthly IM injections.   Patient is requesting a refill on Zyrtec  10mg  daily and Flonase  nasal spray for allergies. It is effective.    ROS See HPI above     Objective:   BP (!) 130/90 (BP Location: Left Arm, Patient Position: Sitting, Cuff Size: Normal)   Pulse 71   Temp 97.7 F (36.5 C) (Oral)   Wt 126 lb 3.2 oz (57.2 kg)   SpO2 98%   BMI 21.00 kg/m    Physical Exam Vitals reviewed.  Constitutional:      General: He is not in acute distress.    Appearance: Normal appearance. He is not ill-appearing, toxic-appearing or diaphoretic.  HENT:     Head: Normocephalic and atraumatic.  Eyes:     General:        Right eye: No discharge.        Left eye: No discharge.     Conjunctiva/sclera: Conjunctivae normal.  Cardiovascular:     Rate and Rhythm: Normal rate and regular rhythm.     Heart sounds: Normal heart sounds. No murmur heard.    No friction rub. No gallop.  Pulmonary:     Effort: Pulmonary effort is normal. No respiratory distress.      Breath sounds: Normal breath sounds.  Musculoskeletal:        General: Normal range of motion.  Skin:    General: Skin is warm and dry.  Neurological:     General: No focal deficit present.     Mental Status: He is alert and oriented to person, place, and time. Mental status is at baseline.     Gait: Gait abnormal.  Psychiatric:        Behavior: Behavior is cooperative.      Assessment & Plan:  Allergic rhinitis, unspecified seasonality, unspecified trigger -     Cetirizine  HCl; Take 1 tablet (10 mg total) by mouth daily.  Dispense: 30 tablet; Refill: 11  Seasonal allergic rhinitis due to pollen -     Fluticasone  Propionate; Place 2 sprays into both nostrils daily as needed for allergies or rhinitis.  Dispense: 18.2 mL; Refill: 3  -Refilled Zyrtec  and Flonase  spray for allergies.  -Discussed about lab results from previous visit. Hemoglobin was improving, 12.7 from 9.5, but still not at goal. Patient prefers to not recheck it since it is so close to normal levels. Agree with this decision.   Return in about 6 months (around 11/28/2024) for physical.   Herby Amick, NP

## 2024-05-28 NOTE — Patient Instructions (Signed)
-  It was great to see you today and I am glad you were able to get in contact with specialist. Continue care with specialist.  -Refilled Zyrtec  and Flonase  spray for allergies.  -Discussed about lab results from previous visit.  -Follow up in 6 months for a physical.

## 2024-06-02 ENCOUNTER — Encounter: Payer: Self-pay | Admitting: Physical Therapy

## 2024-06-02 ENCOUNTER — Ambulatory Visit: Payer: MEDICAID | Attending: Student | Admitting: Physical Therapy

## 2024-06-02 ENCOUNTER — Other Ambulatory Visit: Payer: Self-pay

## 2024-06-02 DIAGNOSIS — M79605 Pain in left leg: Secondary | ICD-10-CM | POA: Diagnosis present

## 2024-06-02 DIAGNOSIS — M25662 Stiffness of left knee, not elsewhere classified: Secondary | ICD-10-CM | POA: Diagnosis present

## 2024-06-02 DIAGNOSIS — R262 Difficulty in walking, not elsewhere classified: Secondary | ICD-10-CM | POA: Diagnosis present

## 2024-06-02 DIAGNOSIS — M6281 Muscle weakness (generalized): Secondary | ICD-10-CM | POA: Diagnosis present
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
## 2024-06-04 ENCOUNTER — Ambulatory Visit (INDEPENDENT_AMBULATORY_CARE_PROVIDER_SITE_OTHER): Payer: MEDICAID

## 2024-06-04 VITALS — BP 124/68 | HR 64 | Ht 65.0 in | Wt 125.8 lb

## 2024-06-04 DIAGNOSIS — F209 Schizophrenia, unspecified: Secondary | ICD-10-CM | POA: Diagnosis not present

## 2024-06-04 MED ORDER — ARIPIPRAZOLE ER 400 MG IM PRSY
400.0000 mg | PREFILLED_SYRINGE | Freq: Once | INTRAMUSCULAR | Status: AC
  Administered 2024-06-04: 400 mg via INTRAMUSCULAR

## 2024-06-04 NOTE — Progress Notes (Cosign Needed)
 Pt presents today for injection of of Abilify  maintenna 400mg  which was tolerated in patients right deltoid. Patient denies all AV, SI and HI. Patient states that this was his second Abilify  injection that was given to him when he was hospitalized.  Patient states that he is doing good overall on medication. Patient will be returning in 28 days.    JNL,CMA

## 2024-06-19 ENCOUNTER — Other Ambulatory Visit: Payer: Self-pay

## 2024-06-19 ENCOUNTER — Encounter (HOSPITAL_COMMUNITY): Payer: Self-pay | Admitting: Emergency Medicine

## 2024-06-19 ENCOUNTER — Emergency Department (HOSPITAL_COMMUNITY)
Admission: EM | Admit: 2024-06-19 | Discharge: 2024-06-19 | Disposition: A | Discharge status: Home / Self Care | Payer: MEDICAID | Attending: Emergency Medicine | Admitting: Emergency Medicine

## 2024-06-19 DIAGNOSIS — R051 Acute cough: Secondary | ICD-10-CM | POA: Insufficient documentation

## 2024-06-19 DIAGNOSIS — R059 Cough, unspecified: Secondary | ICD-10-CM | POA: Diagnosis present

## 2024-06-19 LAB — RESP PANEL BY RT-PCR (RSV, FLU A&B, COVID)  RVPGX2
Influenza A by PCR: NEGATIVE
Influenza B by PCR: NEGATIVE
Resp Syncytial Virus by PCR: NEGATIVE
SARS Coronavirus 2 by RT PCR: NEGATIVE

## 2024-06-19 NOTE — Discharge Instructions (Signed)
 Use your fluticasone  2 sprays each nostril twice daily, use the Zyrtec  that you are prescribed 1 tablet/10 mg daily.  Viral Illness TREATMENT  Treatment is directed at relieving symptoms. There is no cure. Antibiotics are not effective, because the infection is caused by a virus, not by bacteria. Treatment may include:  Increased fluid intake. Sports drinks offer valuable electrolytes, sugars, and fluids.  Breathing heated mist or steam (vaporizer or shower).  Eating chicken soup or other clear broths, and maintaining good nutrition.  Getting plenty of rest.  Using gargles or lozenges for comfort.  Increasing usage of your inhaler if you have asthma.  Return to work when your temperature has returned to normal.  Gargle warm salt water  and spit it out for sore throat. Take benadryl  to decrease sinus secretions. Continue to alternate between Tylenol  and ibuprofen  for pain and fever control.  Follow Up: Follow up with your primary care doctor in 5-7 days for recheck of ongoing symptoms.  Return to emergency department for emergent changing or worsening of symptoms.

## 2024-06-19 NOTE — ED Provider Notes (Signed)
 [place] EMERGENCY DEPARTMENT AT [facility] Provider Note   CSN: [id number] Arrival date & time: 06/19/24  9268     Patient presents with: Cough   William Mayer is a 33 y.o. male.    Cough  Patient had 2 days of cough, sinus congestion, some fatigue and bodyaches.  Denies any chest pain difficulty breathing abdominal pain nausea or vomiting.  No hemoptysis or leg swelling.  Denies any ear pain sore throat or changes of voice.    Prior to Admission medications   Medication Sig Start Date End Date Taking? Authorizing Provider  ARIPiprazole  (ABILIFY ) 5 MG tablet Take 1 tablet (5 mg total) by mouth daily. 05/26/24   Nwoko, Uchenna E, PA  ARIPiprazole  ER (ABILIFY  MAINTENA) 400 MG PRSY prefilled syringe Inject 400 mg into the muscle every 28 (twenty-eight) days. 05/26/24   Nwoko, Uchenna E, PA  cetirizine  (ZYRTEC ) 10 MG tablet Take 1 tablet (10 mg total) by mouth daily. 05/28/24   Williamson, Joanna R, NP  fluticasone  (FLONASE ) 50 MCG/ACT nasal spray Place 2 sprays into both nostrils daily as needed for allergies or rhinitis. 05/28/24 06/27/24  Billy Philippe SAUNDERS, NP  methocarbamol  (ROBAXIN ) 750 MG tablet Take 1 tablet (750 mg total) by mouth 4 (four) times daily. 04/01/24   Danton Lauraine LABOR, PA-C  vitamin D3 (CHOLECALCIFEROL ) 25 MCG tablet Take 1 tablet (1,000 Units total) by mouth daily. 04/02/24 07/01/24  Danton Lauraine LABOR, PA-C    Allergies: Amoxicillin    Review of Systems  Respiratory:  Positive for cough.     Updated Vital Signs BP 133/70 (BP Location: Right Arm)   Pulse 83   Temp 98.4 F (36.9 C) (Oral)   Resp 18   SpO2 95%   Physical Exam Vitals and nursing note reviewed.  Constitutional:      General: He is not in acute distress.    Comments: Pleasant well-appearing 33 year old.  In no acute distress.  Sitting comfortably in bed.  Able answer questions appropriately follow commands. No increased work of breathing. Speaking in full sentences. Wearing mask.    HENT:     Head: Normocephalic and atraumatic.     Nose: Nose normal.     Mouth/Throat:     Mouth: Mucous membranes are moist.  Eyes:     General: No scleral icterus. Cardiovascular:     Rate and Rhythm: Normal rate and regular rhythm.     Pulses: Normal pulses.     Heart sounds: Normal heart sounds.  Pulmonary:     Effort: Pulmonary effort is normal. No respiratory distress.     Breath sounds: Normal breath sounds. No wheezing.  Abdominal:     Palpations: Abdomen is soft.     Tenderness: There is no abdominal tenderness.  Musculoskeletal:     Cervical back: Normal range of motion.     Right lower leg: No edema.     Left lower leg: No edema.  Skin:    General: Skin is warm and dry.     Capillary Refill: Capillary refill takes less than 2 seconds.  Neurological:     Mental Status: He is alert. Mental status is at baseline.  Psychiatric:        Mood and Affect: Mood normal.        Behavior: Behavior normal.     (all labs ordered are listed, but only abnormal results are displayed) Labs Reviewed  RESP PANEL BY RT-PCR (RSV, FLU A&B, COVID)  [id number]    EKG:  None  Radiology: No results found.   Procedures   Medications Ordered in the ED - No data to display                                  Medical Decision Making  Patient had 2 days of cough, sinus congestion, some fatigue and bodyaches.  Denies any chest pain difficulty breathing abdominal pain nausea or vomiting.  No hemoptysis or leg swelling.  Denies any ear pain sore throat or changes of voice.  Physical exam unremarkable patient with clear lungs, no tachycardia, no tachypnea, SpO2 99% Last documented blood pressure measured at 133/70.  Will discharge home at this time.  Return precautions discussed.  I did obtain a PCR swab for COVID RSV influenza which is pending at this time.  Patient will follow-up on this on health portal.  Final diagnoses:  Acute cough    ED Discharge Orders     None           Neldon Hamp RAMAN, [place] 06/19/24 0754    Dasie Faden, MD 06/19/24 1434

## 2024-06-19 NOTE — ED Triage Notes (Signed)
 Pt reports cough and congestion x2 days. Home covid negative yesterday. Denies ear and throat pain.

## 2024-06-22 ENCOUNTER — Telehealth: Payer: Self-pay

## 2024-06-22 NOTE — Transitions of Care (Post Inpatient/ED Visit) (Signed)
   06/22/2024  Name: William Mayer MRN: [id number] DOB: [birth year]  Today's TOC FU Call Status:    Attempted to reach the patient regarding the most recent Inpatient/ED visit.  Follow Up Plan: Additional outreach attempts will be made to reach the patient to complete the Transitions of Care (Post Inpatient/ED visit) call.   Signature : Caroline Longie, CMA

## 2024-06-23 ENCOUNTER — Ambulatory Visit: Payer: MEDICAID | Admitting: Physical Therapy

## 2024-06-25 ENCOUNTER — Encounter: Payer: Self-pay | Admitting: Physical Therapy

## 2024-06-25 ENCOUNTER — Ambulatory Visit: Payer: MEDICAID | Admitting: Physical Therapy

## 2024-06-25 DIAGNOSIS — M79605 Pain in left leg: Secondary | ICD-10-CM

## 2024-06-25 DIAGNOSIS — M25662 Stiffness of left knee, not elsewhere classified: Secondary | ICD-10-CM

## 2024-06-25 DIAGNOSIS — R262 Difficulty in walking, not elsewhere classified: Secondary | ICD-10-CM

## 2024-06-25 DIAGNOSIS — M6281 Muscle weakness (generalized): Secondary | ICD-10-CM
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
## 2024-06-30 ENCOUNTER — Ambulatory Visit: Payer: MEDICAID | Attending: Student

## 2024-06-30 DIAGNOSIS — M79605 Pain in left leg: Secondary | ICD-10-CM | POA: Diagnosis present

## 2024-06-30 DIAGNOSIS — M6281 Muscle weakness (generalized): Secondary | ICD-10-CM | POA: Diagnosis present

## 2024-06-30 DIAGNOSIS — R262 Difficulty in walking, not elsewhere classified: Secondary | ICD-10-CM | POA: Diagnosis present

## 2024-06-30 DIAGNOSIS — M25662 Stiffness of left knee, not elsewhere classified: Secondary | ICD-10-CM | POA: Diagnosis present
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
## 2024-07-02 ENCOUNTER — Ambulatory Visit: Payer: MEDICAID | Admitting: Physical Therapy

## 2024-07-02 ENCOUNTER — Encounter (HOSPITAL_COMMUNITY): Payer: Self-pay

## 2024-07-02 ENCOUNTER — Ambulatory Visit (HOSPITAL_COMMUNITY): Payer: MEDICAID

## 2024-07-02 ENCOUNTER — Encounter: Payer: Self-pay | Admitting: Physical Therapy

## 2024-07-02 VITALS — BP 113/72 | HR 72 | Wt 129.2 lb

## 2024-07-02 DIAGNOSIS — R262 Difficulty in walking, not elsewhere classified: Secondary | ICD-10-CM

## 2024-07-02 DIAGNOSIS — F200 Paranoid schizophrenia: Secondary | ICD-10-CM

## 2024-07-02 DIAGNOSIS — F411 Generalized anxiety disorder: Secondary | ICD-10-CM

## 2024-07-02 DIAGNOSIS — M6281 Muscle weakness (generalized): Secondary | ICD-10-CM

## 2024-07-02 DIAGNOSIS — M79605 Pain in left leg: Secondary | ICD-10-CM

## 2024-07-02 DIAGNOSIS — G4700 Insomnia, unspecified: Secondary | ICD-10-CM

## 2024-07-02 DIAGNOSIS — M25662 Stiffness of left knee, not elsewhere classified: Secondary | ICD-10-CM

## 2024-07-02 MED ORDER — ARIPIPRAZOLE ER 400 MG IM PRSY
400.0000 mg | PREFILLED_SYRINGE | Freq: Once | INTRAMUSCULAR | Status: AC
  Administered 2024-07-02: 400 mg via INTRAMUSCULAR

## 2024-07-02 NOTE — Progress Notes (Signed)
 Pt presents today for injection of of Abilify  maintenna 400mg  which was tolerated in patients right deltoid. Patient denies all AV, SI and HI. SABRA  Patient states that he is doing good overall on medication. Patient will be returning in 30 days.

## 2024-07-02 NOTE — Therapy (Signed)
 Pt arrived today but asked if we could just do a 15 minute session as he had a conflict with another appointment. We ultimately agreed to hold off for today and reschedule as that short of an appointment would not be very beneficial.   No charge for today's visit.   Josette Rough, PT, DPT 07/02/24 9:38 AM
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
## 2024-07-10 ENCOUNTER — Ambulatory Visit: Payer: MEDICAID | Admitting: Podiatry

## 2024-07-16 ENCOUNTER — Ambulatory Visit (INDEPENDENT_AMBULATORY_CARE_PROVIDER_SITE_OTHER): Payer: MEDICAID

## 2024-07-16 ENCOUNTER — Encounter (HOSPITAL_COMMUNITY): Payer: Self-pay

## 2024-07-16 VITALS — BP 124/76 | HR 58 | Ht 65.0 in | Wt 133.0 lb

## 2024-07-16 DIAGNOSIS — F209 Schizophrenia, unspecified: Secondary | ICD-10-CM | POA: Diagnosis not present

## 2024-07-16 DIAGNOSIS — F1020 Alcohol dependence, uncomplicated: Secondary | ICD-10-CM

## 2024-07-21 ENCOUNTER — Ambulatory Visit: Payer: MEDICAID | Admitting: Physical Therapy

## 2024-07-23 ENCOUNTER — Ambulatory Visit: Payer: MEDICAID | Admitting: Physical Therapy

## 2024-07-23 ENCOUNTER — Encounter: Payer: Self-pay | Admitting: Physical Therapy

## 2024-07-23 DIAGNOSIS — M25662 Stiffness of left knee, not elsewhere classified: Secondary | ICD-10-CM

## 2024-07-23 DIAGNOSIS — R262 Difficulty in walking, not elsewhere classified: Secondary | ICD-10-CM

## 2024-07-23 DIAGNOSIS — M79605 Pain in left leg: Secondary | ICD-10-CM | POA: Diagnosis not present

## 2024-07-23 DIAGNOSIS — M6281 Muscle weakness (generalized): Secondary | ICD-10-CM
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
## 2024-07-28 ENCOUNTER — Encounter: Payer: Self-pay | Admitting: Physical Therapy

## 2024-07-28 ENCOUNTER — Ambulatory Visit: Payer: MEDICAID | Admitting: Physical Therapy

## 2024-07-28 DIAGNOSIS — M79605 Pain in left leg: Secondary | ICD-10-CM

## 2024-07-28 DIAGNOSIS — M6281 Muscle weakness (generalized): Secondary | ICD-10-CM

## 2024-07-28 DIAGNOSIS — R262 Difficulty in walking, not elsewhere classified: Secondary | ICD-10-CM

## 2024-07-28 DIAGNOSIS — M25662 Stiffness of left knee, not elsewhere classified: Secondary | ICD-10-CM
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
## 2024-07-30 ENCOUNTER — Ambulatory Visit: Payer: MEDICAID | Admitting: Physical Therapy

## 2024-08-04 ENCOUNTER — Ambulatory Visit: Payer: MEDICAID | Attending: Student | Admitting: Physical Therapy

## 2024-08-04 ENCOUNTER — Ambulatory Visit (INDEPENDENT_AMBULATORY_CARE_PROVIDER_SITE_OTHER): Payer: MEDICAID

## 2024-08-04 ENCOUNTER — Encounter: Payer: Self-pay | Admitting: Family Medicine

## 2024-08-04 ENCOUNTER — Encounter: Payer: Self-pay | Admitting: Physical Therapy

## 2024-08-04 ENCOUNTER — Encounter (HOSPITAL_COMMUNITY): Payer: Self-pay

## 2024-08-04 VITALS — BP 118/60 | HR 75 | Temp 97.5°F | Ht 65.0 in | Wt 127.0 lb

## 2024-08-04 DIAGNOSIS — M79605 Pain in left leg: Secondary | ICD-10-CM | POA: Diagnosis present

## 2024-08-04 DIAGNOSIS — F209 Schizophrenia, unspecified: Secondary | ICD-10-CM

## 2024-08-04 DIAGNOSIS — M6281 Muscle weakness (generalized): Secondary | ICD-10-CM | POA: Diagnosis present

## 2024-08-04 DIAGNOSIS — F39 Unspecified mood [affective] disorder: Secondary | ICD-10-CM

## 2024-08-04 DIAGNOSIS — F1994 Other psychoactive substance use, unspecified with psychoactive substance-induced mood disorder: Secondary | ICD-10-CM

## 2024-08-04 DIAGNOSIS — M25662 Stiffness of left knee, not elsewhere classified: Secondary | ICD-10-CM | POA: Insufficient documentation

## 2024-08-04 DIAGNOSIS — R262 Difficulty in walking, not elsewhere classified: Secondary | ICD-10-CM | POA: Diagnosis present

## 2024-08-04 MED ORDER — ARIPIPRAZOLE ER 400 MG IM PRSY
400.0000 mg | PREFILLED_SYRINGE | Freq: Once | INTRAMUSCULAR | Status: AC
  Administered 2024-08-04: 400 mg via INTRAMUSCULAR

## 2024-08-04 NOTE — Progress Notes (Cosign Needed)
 Patient came into the office today to get his abilify  maintena 400mg  injection. Patient states that he has no thoughts of hurting himself or other. He was not talkative but well dressed and clean.      Pt was given the injection in the left deltoid. Injection was prepared and given by harlene fridge, cma.   Pt tolerated injection well.   Pt to return in 28 days.        NDC 40851-927-19   Lot # [id number] exp 09-28-2026  SN# [id number]  GTTN# [id number].

## 2024-08-04 NOTE — Patient Instructions (Signed)
 Patient came into the office today to get his abilify  maintena 400mg  injection. Patient states that he has no thoughts of hurting himself or other. He was not talkative but well dressed and clean.      Pt was given the injection in the left deltoid. Injection was prepared and given by harlene fridge, cma.   Pt tolerated injection well.   Pt to return in 28 days.        NDC 40851-927-19   Lot # [id number] exp 09-28-2026  SN# [id number]  GTTN# [id number].
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
## 2024-08-06 ENCOUNTER — Ambulatory Visit: Payer: MEDICAID | Admitting: Physical Therapy

## 2024-09-01 ENCOUNTER — Ambulatory Visit (INDEPENDENT_AMBULATORY_CARE_PROVIDER_SITE_OTHER): Payer: MEDICAID

## 2024-09-01 ENCOUNTER — Encounter (HOSPITAL_COMMUNITY): Payer: Self-pay

## 2024-09-01 VITALS — BP 120/70 | HR 77 | Wt 130.2 lb

## 2024-09-01 DIAGNOSIS — F200 Paranoid schizophrenia: Secondary | ICD-10-CM | POA: Diagnosis not present

## 2024-09-01 MED ORDER — ARIPIPRAZOLE ER 400 MG IM PRSY
400.0000 mg | PREFILLED_SYRINGE | Freq: Once | INTRAMUSCULAR | Status: AC
  Administered 2024-09-01: 400 mg via INTRAMUSCULAR

## 2024-09-01 NOTE — Progress Notes (Signed)
 Pt presents today for injection of of Abilify  maintenna 400mg  which was tolerated in patients right deltoid. Patient denies all AV, SI and HI. SABRA  Patient states that he is doing good overall on medication. Patient will be returning in 30 days.

## 2024-09-26 ENCOUNTER — Emergency Department (HOSPITAL_COMMUNITY): Payer: MEDICAID

## 2024-09-26 ENCOUNTER — Emergency Department (HOSPITAL_COMMUNITY)
Admission: EM | Admit: 2024-09-26 | Discharge: 2024-09-26 | Disposition: A | Discharge status: Home / Self Care | Payer: MEDICAID | Attending: Emergency Medicine | Admitting: Emergency Medicine

## 2024-09-26 ENCOUNTER — Encounter (HOSPITAL_COMMUNITY): Payer: Self-pay

## 2024-09-26 ENCOUNTER — Other Ambulatory Visit: Payer: Self-pay

## 2024-09-26 DIAGNOSIS — J0110 Acute frontal sinusitis, unspecified: Secondary | ICD-10-CM | POA: Insufficient documentation

## 2024-09-26 DIAGNOSIS — R0981 Nasal congestion: Secondary | ICD-10-CM | POA: Diagnosis present

## 2024-09-26 HISTORY — DX: Bipolar disorder, unspecified: F31.9

## 2024-09-26 LAB — CBC WITH DIFFERENTIAL/PLATELET
Abs Immature Granulocytes: 0.01 K/uL (ref 0.00–0.07)
Basophils Absolute: 0 K/uL (ref 0.0–0.1)
Basophils Relative: 1 %
Eosinophils Absolute: 0.2 K/uL (ref 0.0–0.5)
Eosinophils Relative: 3 %
HCT: 43.3 % (ref 39.0–52.0)
Hemoglobin: 13.9 g/dL (ref 13.0–17.0)
Immature Granulocytes: 0 %
Lymphocytes Relative: 29 %
Lymphs Abs: 2 K/uL (ref 0.7–4.0)
MCH: 30.5 pg (ref 26.0–34.0)
MCHC: 32.1 g/dL (ref 30.0–36.0)
MCV: 95.2 fL (ref 80.0–100.0)
Monocytes Absolute: 0.4 K/uL (ref 0.1–1.0)
Monocytes Relative: 5 %
Neutro Abs: 4.1 K/uL (ref 1.7–7.7)
Neutrophils Relative %: 62 %
Platelets: 281 K/uL (ref 150–400)
RBC: 4.55 MIL/uL (ref 4.22–5.81)
RDW: 13 % (ref 11.5–15.5)
WBC: 6.6 K/uL (ref 4.0–10.5)
nRBC: 0 % (ref 0.0–0.2)

## 2024-09-26 LAB — RESP PANEL BY RT-PCR (RSV, FLU A&B, COVID)  RVPGX2
Influenza A by PCR: NEGATIVE
Influenza B by PCR: NEGATIVE
Resp Syncytial Virus by PCR: NEGATIVE
SARS Coronavirus 2 by RT PCR: NEGATIVE

## 2024-09-26 LAB — BASIC METABOLIC PANEL WITH GFR
Anion gap: 7 (ref 5–15)
BUN: 10 mg/dL (ref 6–20)
CO2: 27 mmol/L (ref 22–32)
Calcium: 9.4 mg/dL (ref 8.9–10.3)
Chloride: 104 mmol/L (ref 98–111)
Creatinine, Ser: 0.97 mg/dL (ref 0.61–1.24)
GFR, Estimated: >60 mL/min (ref >60)
Glucose, Bld: 100 mg/dL — ABNORMAL HIGH (ref 70–99)
Potassium: 4.2 mmol/L (ref 3.5–5.1)
Sodium: 137 mmol/L (ref 135–145)

## 2024-09-26 MED ORDER — DOXYCYCLINE HYCLATE 100 MG PO CAPS: 100.0000 mg | ORAL_CAPSULE | Freq: Two times a day (BID) | ORAL | 0 refills | Status: AC

## 2024-09-26 NOTE — Discharge Instructions (Addendum)
 Thank you for visiting the Emergency Department today. It was a pleasure to be part of your healthcare team.  You were seen today for acute sinusitis and prescribed an antibiotic.  Please complete the entire antibiotic regimen as prescribed. If you have any questions about your medicines, please call your pharmacy or healthcare provider. At home, rest, hydrate, utilize supportive care measures such as Tylenol  and ibuprofen  for pain management as tolerated, and you may continue your Zyrtec  as prescribed. It is important to watch for warning signs such as worsening pain, fever, or no improvement of symptoms with antibiotic regimen.  If any of these happen, return to the Emergency Department or call 911. Thank you for trusting us  with your health.

## 2024-09-26 NOTE — ED Provider Notes (Signed)
 [place] EMERGENCY DEPARTMENT AT [facility] Provider Note   CSN: [id number] Arrival date & time: 09/26/24  9067     Patient presents with: Cough   William Mayer is a 33 y.o. male who is seen in the emergency department today for ongoing nasal congestion and cough for the past 3 weeks. Patient states that he has had no improvement of symptoms over the last 3 weeks despite supportive care measures. Patient states that he is unsure if he has had fevers. Patient endorses rhinorrhea/congestion that is yellow in color, post nasal drip, cough, congestion, and sinus pain. Patient denies chest pain or shortness of breath. Patient denies N/V/D. Patient is in no acute distress at this time.   Cough      Prior to Admission medications   Medication Sig Start Date End Date Taking? Authorizing Provider  doxycycline (VIBRAMYCIN) 100 MG capsule Take 1 capsule (100 mg total) by mouth 2 (two) times daily for 7 days. 09/26/24 10/03/24 Yes Shalon Salado L, PA  ARIPiprazole  (ABILIFY ) 5 MG tablet Take 1 tablet (5 mg total) by mouth daily. 05/26/24   Nwoko, Uchenna E, PA  ARIPiprazole  ER (ABILIFY  MAINTENA) 400 MG PRSY prefilled syringe Inject 400 mg into the muscle every 28 (twenty-eight) days. 05/26/24   Nwoko, Uchenna E, PA  cetirizine  (ZYRTEC ) 10 MG tablet Take 1 tablet (10 mg total) by mouth daily. 05/28/24   Williamson, Joanna R, NP  fluticasone  (FLONASE ) 50 MCG/ACT nasal spray Place 2 sprays into both nostrils daily as needed for allergies or rhinitis. 05/28/24 08/04/24  Billy Philippe SAUNDERS, NP  methocarbamol  (ROBAXIN ) 750 MG tablet Take 1 tablet (750 mg total) by mouth 4 (four) times daily. 04/01/24   Danton Lauraine LABOR, PA-C    Allergies: Amoxicillin    Review of Systems  HENT:  Positive for congestion and sinus pain.     Updated Vital Signs BP 125/72   Pulse 69   Temp 98.7 F (37.1 C) (Oral)   Resp 19   Ht 5' 5 (1.651 m)   Wt 57.6 kg   SpO2 98%   BMI 21.13 kg/m   Physical  Exam Vitals and nursing note reviewed.  Constitutional:      General: He is not in acute distress.    Appearance: Normal appearance.  HENT:     Head: Normocephalic and atraumatic.     Comments: Tenderness to frontal and maxillary sinus bilaterally.    Nose: Congestion and rhinorrhea present.     Right Sinus: Maxillary sinus tenderness and frontal sinus tenderness present.     Left Sinus: Maxillary sinus tenderness and frontal sinus tenderness present.  Eyes:     Extraocular Movements: Extraocular movements intact.     Conjunctiva/sclera: Conjunctivae normal.     Pupils: Pupils are equal, round, and reactive to light.  Cardiovascular:     Rate and Rhythm: Normal rate and regular rhythm.     Pulses: Normal pulses.  Pulmonary:     Effort: Pulmonary effort is normal. No respiratory distress.     Breath sounds: Normal breath sounds. No decreased air movement.     Comments: Patient has no difficulty speaking in complete sentences. Abdominal:     General: Abdomen is flat.     Palpations: Abdomen is soft.     Tenderness: There is no abdominal tenderness.  Musculoskeletal:        General: Normal range of motion.     Cervical back: Normal range of motion.  Skin:  General: Skin is warm and dry.     Capillary Refill: Capillary refill takes less than 2 seconds.  Neurological:     General: No focal deficit present.     Mental Status: He is alert. Mental status is at baseline.  Psychiatric:        Mood and Affect: Mood normal.     (all labs ordered are listed, but only abnormal results are displayed) Labs Reviewed  BASIC METABOLIC PANEL WITH GFR - Abnormal; Notable for the following components:      Result Value   Glucose, Bld 100 (*)    All other components within normal limits  RESP PANEL BY RT-PCR (RSV, FLU A&B, COVID)  [id number]  CBC WITH DIFFERENTIAL/PLATELET    EKG: None  Radiology: DG Chest 2 View Result Date: 09/26/2024 CLINICAL DATA:  Cough for 3 weeks. EXAM: CHEST -  2 VIEW COMPARISON:  03/27/2024. FINDINGS: Normal heart, mediastinum and hila. Clear lungs.  No pleural effusion or pneumothorax. Old right AC joint separation. Skeletal structures otherwise unremarkable. IMPRESSION: No active cardiopulmonary disease. Electronically Signed   By: Alm Parkins M.D.   On: 09/26/2024 10:41     Procedures   Medications Ordered in the ED - No data to display                                Medical Decision Making Amount and/or Complexity of Data Reviewed Labs: ordered. Decision-making details documented in ED Course. Radiology: ordered. Decision-making details documented in ED Course.  Risk Prescription drug management.   Patient presents to the ED for: cough and congestion x 3 weeks This involves an extensive number of treatment options  Differential diagnosis includes:  Infectious etiology-pneumonia, sinusitis, viral etiology Co-morbid conditions: Schizophrenia  Additional history/records obtained and reviewed: Additional history obtained from  family  External records from outside source obtained and reviewed including prior ED visit on 06/19/24 for same complaint.  Clinical Course as of 09/27/24 0012  Sat Sep 26, 2024  1020 Patient states that he has been having cough & congestion x 3 weeks. [ML]  1024 Temp: 98.7 F (37.1 C) Patient afebrile. Vitals WNL. Denies subjective fevers at home.  [ML]  1034 Resp panel by RT-PCR (RSV, Flu A&B, Covid) Anterior Nasal Swab RT-PCR viral panel negative   [ML]  1127 DG Chest 2 View No acute findings.  No evidence of infiltrate or acute infectious process. [ML]  1127 CBC with Differential No acute findings.  [ML]  1220 Basic metabolic panel(!) No acute findings. [ML]    Clinical Course User Index [ML] William Mayer, [place]    Data Reviewed / Actions Taken: Labs ordered/reviewed with my independent interpretation in ED course above. Imaging ordered/reviewed with my independent interpretation in ED course  above. I agree with the radiologists interpretation.  Key findings for the patient were reviewed with the attending physician, and ongoing clinical collaboration was maintained throughout the visit  Test Considered/Diagnostic tools:  Additional diagnostic testing was no considered based on the patient's presenting symptoms, risk factors, and initial clinical assessment.   ED Course / Reassessments: Problem List: cough and congestion x 20 weeks 33 year old male presented for multiple complaints; including nasal congestion, headaches, cough, and sinus pain. Initial assessment included history, physical exam, and review of prior medical records. Vital signs were obtained and monitored, and the patient remained stable throughout the stay. General physical exam revealed frontal and maxillary sinus tenderness, lungs CTAB.  Laboratory studies and imaging were obtained with no acute findings. Given patient's history of present illness, physical exam findings of mild sinus tenderness, and ongoing sinus pain x 3 weeks-management included discharge with supportive care measures and antibiotic treatment for acute sinusitis.  Serial reassessments performed: Yes  Social determinants impacting care: mental health/psychiatric barriers   Disposition: Disposition: Discharge with close follow-up with PCP for further evaluation and care.  Patient prescribed an antibiotic regimen and instructed to complete the full course.   The disposition plan and rationale were discussed with the patient at the bedside, all questions were addressed, and the patient demonstrated understanding.  This note was produced using Electronics Engineer. While the provider has reviewed and verified all clinical information, transcription errors may remain.       Final diagnoses:  Acute frontal sinusitis, recurrence not specified    ED Discharge Orders          Ordered    doxycycline (VIBRAMYCIN) 100 MG capsule  2 times  daily        09/26/24 1237               Lachrisha Ziebarth L, [place] 09/27/24 0014    Zammit, Joseph, MD 09/29/24 1006

## 2024-09-26 NOTE — ED Triage Notes (Signed)
 Patient has had a cough for 3 weeks with green mucus that wont go away. Has increased fatigue.

## 2024-09-29 ENCOUNTER — Encounter (HOSPITAL_COMMUNITY): Payer: Self-pay

## 2024-09-29 ENCOUNTER — Ambulatory Visit (INDEPENDENT_AMBULATORY_CARE_PROVIDER_SITE_OTHER): Payer: MEDICAID

## 2024-09-29 VITALS — BP 107/68 | HR 82 | Wt 132.6 lb

## 2024-09-29 DIAGNOSIS — F200 Paranoid schizophrenia: Secondary | ICD-10-CM | POA: Diagnosis not present

## 2024-09-29 MED ORDER — ARIPIPRAZOLE ER 400 MG IM PRSY
400.0000 mg | PREFILLED_SYRINGE | Freq: Once | INTRAMUSCULAR | Status: AC
  Administered 2024-09-29: 400 mg via INTRAMUSCULAR

## 2024-09-29 NOTE — Progress Notes (Signed)
 Pt presents today for injection of of Abilify  maintenna 400mg  which was tolerated in patients right deltoid. Patient denies all AV, SI and HI. William Mayer Patient states that he is doing good overall on medication. Patient will be returning in 28 days.

## 2024-10-08 ENCOUNTER — Ambulatory Visit (INDEPENDENT_AMBULATORY_CARE_PROVIDER_SITE_OTHER): Payer: MEDICAID

## 2024-10-08 VITALS — BP 118/63 | HR 79 | Ht 65.0 in | Wt 134.0 lb

## 2024-10-08 DIAGNOSIS — F1020 Alcohol dependence, uncomplicated: Secondary | ICD-10-CM

## 2024-10-08 DIAGNOSIS — F209 Schizophrenia, unspecified: Secondary | ICD-10-CM
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
## 2024-10-27 ENCOUNTER — Ambulatory Visit (INDEPENDENT_AMBULATORY_CARE_PROVIDER_SITE_OTHER): Payer: MEDICAID

## 2024-10-27 ENCOUNTER — Ambulatory Visit (HOSPITAL_COMMUNITY): Payer: MEDICAID

## 2024-10-27 ENCOUNTER — Encounter (HOSPITAL_COMMUNITY): Payer: Self-pay

## 2024-10-27 VITALS — BP 118/72 | HR 63 | Temp 98.1°F | Ht 65.0 in | Wt 137.2 lb

## 2024-10-27 DIAGNOSIS — F209 Schizophrenia, unspecified: Secondary | ICD-10-CM | POA: Diagnosis not present

## 2024-10-27 MED ORDER — ARIPIPRAZOLE ER 400 MG IM PRSY
400.0000 mg | PREFILLED_SYRINGE | INTRAMUSCULAR | Status: AC
  Administered 2024-10-27 – 2024-11-24 (×2): 400 mg via INTRAMUSCULAR

## 2024-10-27 NOTE — Progress Notes (Signed)
 Pt presented for Injection of Abilify  400 mg   in LEFT deltoid. Pt tolerated injection well. Pt denies SI/HI/AVH. No additional concerns. Pt will return in 28 days.   CJT- CMA Student

## 2024-10-31 ENCOUNTER — Other Ambulatory Visit: Payer: Self-pay

## 2024-10-31 ENCOUNTER — Emergency Department (HOSPITAL_COMMUNITY)
Admission: EM | Admit: 2024-10-31 | Discharge: 2024-10-31 | Disposition: A | Discharge status: Home / Self Care | Payer: MEDICAID

## 2024-10-31 ENCOUNTER — Encounter (HOSPITAL_COMMUNITY): Payer: Self-pay | Admitting: Emergency Medicine

## 2024-10-31 ENCOUNTER — Emergency Department (HOSPITAL_COMMUNITY): Payer: MEDICAID

## 2024-10-31 DIAGNOSIS — W19XXXA Unspecified fall, initial encounter: Secondary | ICD-10-CM

## 2024-10-31 DIAGNOSIS — S60414A Abrasion of right ring finger, initial encounter: Secondary | ICD-10-CM | POA: Insufficient documentation

## 2024-10-31 DIAGNOSIS — W108XXA Fall (on) (from) other stairs and steps, initial encounter: Secondary | ICD-10-CM | POA: Insufficient documentation

## 2024-10-31 DIAGNOSIS — M25512 Pain in left shoulder: Secondary | ICD-10-CM | POA: Diagnosis not present

## 2024-10-31 DIAGNOSIS — Z720 Tobacco use: Secondary | ICD-10-CM | POA: Insufficient documentation

## 2024-10-31 DIAGNOSIS — S60416A Abrasion of right little finger, initial encounter: Secondary | ICD-10-CM | POA: Insufficient documentation

## 2024-10-31 DIAGNOSIS — M79641 Pain in right hand: Secondary | ICD-10-CM | POA: Diagnosis present

## 2024-10-31 LAB — URINALYSIS, ROUTINE W REFLEX MICROSCOPIC
Bilirubin Urine: NEGATIVE
Glucose, UA: NEGATIVE mg/dL
Hgb urine dipstick: NEGATIVE
Ketones, ur: NEGATIVE mg/dL
Leukocytes,Ua: NEGATIVE
Nitrite: NEGATIVE
Protein, ur: 30 mg/dL — AB
Specific Gravity, Urine: 1.025 (ref 1.005–1.030)
pH: 5 (ref 5.0–8.0)

## 2024-10-31 LAB — I-STAT CHEM 8, ED
BUN: 15 mg/dL (ref 6–20)
Calcium, Ion: 1.17 mmol/L (ref 1.15–1.40)
Chloride: 106 mmol/L (ref 98–111)
Creatinine, Ser: 1.2 mg/dL (ref 0.61–1.24)
Glucose, Bld: 98 mg/dL (ref 70–99)
HCT: 42 % (ref 39.0–52.0)
Hemoglobin: 14.3 g/dL (ref 13.0–17.0)
Potassium: 4.2 mmol/L (ref 3.5–5.1)
Sodium: 143 mmol/L (ref 135–145)
TCO2: 24 mmol/L (ref 22–32)

## 2024-10-31 LAB — CBC
HCT: 43.6 % (ref 39.0–52.0)
Hemoglobin: 14.4 g/dL (ref 13.0–17.0)
MCH: 31.4 pg (ref 26.0–34.0)
MCHC: 33 g/dL (ref 30.0–36.0)
MCV: 95.2 fL (ref 80.0–100.0)
Platelets: 220 K/uL (ref 150–400)
RBC: 4.58 MIL/uL (ref 4.22–5.81)
RDW: 12.7 % (ref 11.5–15.5)
WBC: 5.2 K/uL (ref 4.0–10.5)
nRBC: 0 % (ref 0.0–0.2)

## 2024-10-31 LAB — URINE DRUG SCREEN
Amphetamines: POSITIVE — AB
Barbiturates: NEGATIVE
Benzodiazepines: NEGATIVE
Cocaine: POSITIVE — AB
Fentanyl: POSITIVE — AB
Methadone Scn, Ur: NEGATIVE
Opiates: NEGATIVE
Tetrahydrocannabinol: POSITIVE — AB

## 2024-10-31 LAB — COMPREHENSIVE METABOLIC PANEL WITH GFR
ALT: 18 U/L (ref 0–44)
AST: 23 U/L (ref 15–41)
Albumin: 4.3 g/dL (ref 3.5–5.0)
Alkaline Phosphatase: 115 U/L (ref 38–126)
Anion gap: 10 (ref 5–15)
BUN: 13 mg/dL (ref 6–20)
CO2: 25 mmol/L (ref 22–32)
Calcium: 9.6 mg/dL (ref 8.9–10.3)
Chloride: 106 mmol/L (ref 98–111)
Creatinine, Ser: 1.21 mg/dL (ref 0.61–1.24)
GFR, Estimated: >60 mL/min
Glucose, Bld: 105 mg/dL — ABNORMAL HIGH (ref 70–99)
Potassium: 4.3 mmol/L (ref 3.5–5.1)
Sodium: 141 mmol/L (ref 135–145)
Total Bilirubin: 0.2 mg/dL (ref 0.0–1.2)
Total Protein: 7.6 g/dL (ref 6.5–8.1)

## 2024-10-31 LAB — I-STAT CG4 LACTIC ACID, ED: Lactic Acid, Venous: 1.7 mmol/L (ref 0.5–1.9)

## 2024-10-31 LAB — ETHANOL: Alcohol, Ethyl (B): <15 mg/dL

## 2024-10-31 MED ORDER — IOHEXOL 350 MG/ML SOLN
75.0000 mL | Freq: Once | INTRAVENOUS | Status: AC | PRN
  Administered 2024-10-31: 75 mL via INTRAVENOUS

## 2024-10-31 MED ORDER — TETANUS-DIPHTH-ACELL PERTUSSIS 5-2-15.5 LF-MCG/0.5 IM SUSP: 0.5000 mL | Freq: Once | INTRAMUSCULAR | Status: DC

## 2024-10-31 MED ORDER — LACTATED RINGERS IV BOLUS
1000.0000 mL | Freq: Once | INTRAVENOUS | Status: AC
  Administered 2024-10-31: 1000 mL via INTRAVENOUS

## 2024-10-31 NOTE — ED Notes (Signed)
 C-collar removed per provider order. Pt also given soda and sandwich bag.

## 2024-10-31 NOTE — ED Notes (Signed)
 Wound cleaned and dressed as ordered by provider.

## 2024-10-31 NOTE — ED Provider Notes (Signed)
 " [facility] Provider Note   CSN: [id number] Arrival date & time: 10/31/24  1645     Patient presents with: found on ground/? SEIZURE and Altered Mental Status   William Mayer is a 34 y.o. male.   34 year old male presenting after a fall.  Poor historian, patient reports that he tripped forward and fell down some wooden stairs, per EMS there were several bystanders at the scene that made references to the patient smoking something before falling, was unresponsive at EMSs initial assessment but then became responsive to pain, he denies alcohol or substance use.  Reports R hand pain secondary to abrasions at his right 4th/5th PIP, also notes discomfort of his shoulders bilaterally specifically with certain movements.  Denies head injury, although he does have dried blood noted to his face, no obvious deformity or laceration to explain source of bleed. C-collar in place.  Denies chest pain, abdominal pain, shortness of breath, headache, vision changes.   Altered Mental Status      Prior to Admission medications  Medication Sig Start Date End Date Taking? Authorizing Provider  ARIPiprazole  (ABILIFY ) 5 MG tablet Take 1 tablet (5 mg total) by mouth daily. 05/26/24   Nwoko, Uchenna E, PA  ARIPiprazole  ER (ABILIFY  MAINTENA) 400 MG PRSY prefilled syringe Inject 400 mg into the muscle every 28 (twenty-eight) days. 05/26/24   Nwoko, Uchenna E, PA  cetirizine  (ZYRTEC ) 10 MG tablet Take 1 tablet (10 mg total) by mouth daily. 05/28/24   Williamson, Joanna R, NP  fluticasone  (FLONASE ) 50 MCG/ACT nasal spray Place 2 sprays into both nostrils daily as needed for allergies or rhinitis. 05/28/24 08/04/24  Billy Philippe SAUNDERS, NP  methocarbamol  (ROBAXIN ) 750 MG tablet Take 1 tablet (750 mg total) by mouth 4 (four) times daily. 04/01/24   Danton Lauraine LABOR, PA-C    Allergies: Amoxicillin    Review of Systems  Updated Vital Signs  Vitals:   10/31/24 1823  10/31/24 1825 10/31/24 1845 10/31/24 1915  BP: 118/77  134/84 134/84  Pulse: 66  61 78  Resp: 15  17 20   Temp:  97.7 F (36.5 C)  97.7 F (36.5 C)  TempSrc:  Oral  Oral  SpO2: 99%  100% 100%     Physical Exam Vitals and nursing note reviewed.  Constitutional:      General: He is not in acute distress.    Appearance: He is not ill-appearing or toxic-appearing.  HENT:     Head: Normocephalic and atraumatic.     Comments: No Battle's sign or raccoon eyes Dried blood across face, no obvious laceration/deformity to explain source of blood Eyes:     Extraocular Movements: Extraocular movements intact.     Pupils: Pupils are equal, round, and reactive to light.  Neck:     Comments: C-collar in place Cardiovascular:     Rate and Rhythm: Normal rate and regular rhythm.     Pulses:          Radial pulses are 2+ on the right side and 2+ on the left side.     Heart sounds: Normal heart sounds.     Comments: No chest wall tenderness Pulmonary:     Effort: Pulmonary effort is normal. No respiratory distress.     Breath sounds: Normal breath sounds.  Abdominal:     Palpations: Abdomen is soft.     Tenderness: There is no abdominal tenderness. There is no guarding.  Musculoskeletal:     Comments:  No appreciable bony deformity/tenderness of bilateral LE's or pelvis Ue's: Generalized tenderness on palpation of shoulders bilaterally without appreciable bony deformity, full passive ROM at the shoulders bilaterally however cross-body reach elicits pain in the left shoulder. Full active/passive ROM at the elbows and wrists bilaterally. Grip strength intact/equal bilaterally, full ROM at MCP/PIP/DIP bilaterally.  Skin:    General: Skin is warm and dry.     Comments: Abrasions noted to R hand at the 4th/5th PIP, not actively bleeding  Neurological:     General: No focal deficit present.     Mental Status: He is alert and oriented to person, place, and time.     Sensory: No sensory deficit.      Motor: No weakness.     Comments: Answers questions appropriately but is slow to respond at times No tremor     (all labs ordered are listed, but only abnormal results are displayed) Labs Reviewed  COMPREHENSIVE METABOLIC PANEL WITH GFR - Abnormal; Notable for the following components:      Result Value   Glucose, Bld 105 (*)    All other components within normal limits  URINALYSIS, ROUTINE W REFLEX MICROSCOPIC - Abnormal; Notable for the following components:   APPearance HAZY (*)    Protein, ur 30 (*)    Bacteria, UA RARE (*)    All other components within normal limits  URINE DRUG SCREEN - Abnormal; Notable for the following components:   Cocaine POSITIVE (*)    Amphetamines POSITIVE (*)    Tetrahydrocannabinol POSITIVE (*)    Fentanyl  POSITIVE (*)    All other components within normal limits  URINE CULTURE  CBC  [name]  I-STAT CHEM 8, ED  I-STAT CG4 LACTIC ACID, ED    EKG: None  Radiology: DG Shoulder Left Result Date: 10/31/2024 CLINICAL DATA:  Initial evaluation for acute trauma, fall. EXAM: LEFT SHOULDER - 2+ VIEW COMPARISON:  None Available. FINDINGS: No acute fracture or dislocation. Mild degenerative spurring about the left EC articulation. No visible soft tissue injury. Visualized left hemithorax largely clear. IMPRESSION: No acute osseous abnormality about the left shoulder. Electronically Signed   By: Morene Hoard M.D.   On: 10/31/2024 19:46   CT CERVICAL SPINE WO CONTRAST Result Date: 10/31/2024 CLINICAL DATA:  Initial evaluation for acute blunt poly trauma. EXAM: CT CERVICAL SPINE WITHOUT CONTRAST TECHNIQUE: Multidetector CT imaging of the cervical spine was performed without intravenous contrast. Multiplanar CT image reconstructions were also generated. RADIATION DOSE REDUCTION: This exam was performed according to the departmental dose-optimization program which includes automated exposure control, adjustment of the mA and/or kV according to patient size  and/or use of iterative reconstruction technique. COMPARISON:  Prior study from 04/27/2020. FINDINGS: Alignment: Straightening of the normal cervical lordosis. No listhesis or malalignment. Skull base and vertebrae: Skull base intact. Normal C1-2 articulations are preserved and the dens is intact. Vertebral body heights maintained. No acute fracture. Soft tissues and spinal canal: Soft tissues of the neck demonstrate no acute finding. No abnormal prevertebral edema. Spinal canal within normal limits. Disc levels:  Unremarkable. Upper chest: Visualized upper chest demonstrates no acute finding. Partially visualized lung apices are clear. Other: None. IMPRESSION: No acute traumatic injury within the cervical spine. Electronically Signed   By: Morene Hoard M.D.   On: 10/31/2024 19:44   CT HEAD WO CONTRAST Result Date: 10/31/2024 CLINICAL DATA:  Initial evaluation for acute head trauma. EXAM: CT HEAD WITHOUT CONTRAST TECHNIQUE: Contiguous axial images were obtained from the base of the  skull through the vertex without intravenous contrast. RADIATION DOSE REDUCTION: This exam was performed according to the departmental dose-optimization program which includes automated exposure control, adjustment of the mA and/or kV according to patient size and/or use of iterative reconstruction technique. COMPARISON:  Prior CT from 04/21/2022. FINDINGS: Brain: Cerebral volume within normal limits for patient age. No acute intracranial hemorrhage. No acute large vessel territory infarct. No mass lesion, midline shift, or mass effect. Ventricles are normal in size without hydrocephalus. No extra-axial fluid collection. Vascular: No abnormal hyperdense vessel. Skull: Scalp soft tissues demonstrate no acute abnormality. Calvarium intact. Sinuses/Orbits: Globes and orbital soft tissues within normal limits. Mild mucosal thickening about the maxillary sinuses. Paranasal sinuses are otherwise largely clear. Mastoid air cells and  middle ear cavities are clear. IMPRESSION: Negative head CT. No acute intracranial abnormality. Electronically Signed   By: Morene Hoard M.D.   On: 10/31/2024 19:38   CT MAXILLOFACIAL WO CONTRAST Result Date: 10/31/2024 CLINICAL DATA:  Initial evaluation for acute left facial trauma. EXAM: CT MAXILLOFACIAL WITHOUT CONTRAST TECHNIQUE: Multidetector CT imaging of the maxillofacial structures was performed. Multiplanar CT image reconstructions were also generated. RADIATION DOSE REDUCTION: This exam was performed according to the departmental dose-optimization program which includes automated exposure control, adjustment of the mA and/or kV according to patient size and/or use of iterative reconstruction technique. COMPARISON:  None Available. FINDINGS: Osseous: Zygomatic arches intact. No acute maxillary fracture. Pterygoid plates intact. No acute nasal bone fracture. Nasal septum relatively midline and intact. Mandible intact. Mandibular condyles normally situated. No acute abnormality about the dentition. Few scattered dental caries noted. Orbits: Globes orbital soft tissues within normal limits. Bony orbits intact. Sinuses: Mucosal thickening about the ethmoidal air cells and maxillary sinuses. No hemosinus. Soft tissues: No appreciable soft tissue injury about the face by CT. Limited intracranial: Unremarkable. IMPRESSION: 1. No acute maxillofacial injury. No fracture. 2. Mild ethmoidal and maxillary sinus disease. Electronically Signed   By: Morene Hoard M.D.   On: 10/31/2024 19:35   DG Shoulder Right Result Date: 10/31/2024 EXAM: 1 VIEW(S) XRAY OF THE RIGHT SHOULDER 10/31/2024 07:18:58 PM COMPARISON: Comparison with chest radiograph 10/31/2024 and 09/26/2024. CLINICAL HISTORY: fall, pain with ROM FINDINGS: BONES AND JOINTS: Glenohumeral joint is normally aligned. No evidence of acute fracture or dislocation of the right shoulder. Old deformity with callus formation of the distal right  clavicle suggesting either old postoperative or posttraumatic changes. No focal bone lesion or bone destruction. The [facility] joint is unremarkable. SOFT TISSUES: Soft tissues are unremarkable. No abnormal calcifications. Visualized lung is unremarkable. IMPRESSION: 1. No acute fracture or dislocation of the right shoulder. 2. Old deformity with callus formation of the distal right clavicle, suggesting either old postoperative or posttraumatic changes. Electronically signed by: Elsie Gravely MD 10/31/2024 07:33 PM EST RP Workstation: [id number]   CT CHEST ABDOMEN PELVIS W CONTRAST Result Date: 10/31/2024 EXAM: CT CHEST, ABDOMEN AND PELVIS WITH CONTRAST 10/31/2024 07:12:08 PM TECHNIQUE: CT of the chest, abdomen and pelvis was performed with the administration of intravenous contrast. Multiplanar reformatted images are provided for review. Automated exposure control, iterative reconstruction, and/or weight based adjustment of the mA/kV was utilized to reduce the radiation dose to as low as reasonably achievable. Motion artifact limits examination. 75 mL (iohexol  (OMNIPAQUE ) 350 MG/ML injection 75 mL IOHEXOL  350 MG/ML SOLN) was administered. COMPARISON: Comparison with chest and pelvic radiographs 10/31/2024 and CT chest abdomen and pelvis 03/27/2024. CLINICAL HISTORY: Polytrauma, blunt. The patient was found on the ground unresponsive. FINDINGS: CHEST: MEDIASTINUM AND  LYMPH NODES: Heart and pericardium are unremarkable. The central airways are clear. No mediastinal, hilar or axillary lymphadenopathy. LUNGS AND PLEURA: Mild dependent atelectasis in the lung bases. No pleural effusion or pneumothorax. ABDOMEN AND PELVIS: LIVER: The liver is normal. GALLBLADDER AND BILE DUCTS: The gallbladder is normal. No biliary ductal dilatation. SPLEEN: The spleen is normal. PANCREAS: The pancreas is normal. ADRENAL GLANDS: The adrenal glands are normal. KIDNEYS, URETERS AND BLADDER: The kidneys, ureters, and bladder are normal. No  stones in the kidneys or ureters. No hydronephrosis. No perinephric or periureteral stranding. GI AND BOWEL: The stomach, small bowel, and colon are not abnormally distended. There is no wall thickening or inflammatory stranding. No mesenteric hematoma is identified. The appendix is normal. There is no bowel obstruction. REPRODUCTIVE ORGANS: The prostate gland is not enlarged. PERITONEUM AND RETROPERITONEUM: No free air or free fluid in the abdomen. VASCULATURE: The abdominal aorta is of normal caliber. No aneurysm or dissection is seen. ABDOMINAL AND PELVIS LYMPH NODES: There is no significant lymphadenopathy. BONES AND SOFT TISSUES: Deformity of the distal right clavicle. No acute osseous abnormality. No focal soft tissue abnormality. IMPRESSION: 1. No evidence of acute traumatic injury. Evaluation limited by motion artifact. 2. Old Deformity of the distal right clavicle. Electronically signed by: Elsie Gravely MD 10/31/2024 07:32 PM EST RP Workstation: [id number]   DG Pelvis Portable Result Date: 10/31/2024 EXAM: 1 or 2 VIEW(S) XRAY OF THE PELVIS 10/31/2024 06:35:00 PM COMPARISON: Comparison with 03/27/2024. CLINICAL HISTORY: Trauma. FINDINGS: BONES AND JOINTS: No acute fracture. No malalignment. SOFT TISSUES: The soft tissues are unremarkable. IMPRESSION: 1. No evidence of acute traumatic injury. Electronically signed by: Elsie Gravely MD 10/31/2024 07:12 PM EST RP Workstation: [id number]   DG Hand 2 View Right Result Date: 10/31/2024 EXAM: 1 or 2 VIEW(S) XRAY OF THE RIGHT HAND 10/31/2024 06:35:00 PM COMPARISON: None available. CLINICAL HISTORY: Trauma due to a fall. FINDINGS: BONES AND JOINTS: No acute fracture or dislocation. No malalignment. Joint spaces are normal. SOFT TISSUES: Bandaging over the 4th and 5th fingers limits evaluation. No radiopaque soft tissue foreign bodies are identified. IMPRESSION: 1. No acute fracture or dislocation. 2. Bandaging over the 4th and 5th fingers limits evaluation.  3. No radiopaque soft tissue foreign body is identified. Electronically signed by: Elsie Gravely MD 10/31/2024 07:09 PM EST RP Workstation: [id number]   DG Hand 2 View Left Result Date: 10/31/2024 EXAM: 1 or 2 VIEW(S) XRAY OF THE LEFT HAND 10/31/2024 06:35:00 PM COMPARISON: None available. CLINICAL HISTORY: Trauma due to a fall. FINDINGS: BONES AND JOINTS: No acute bony abnormalities. SOFT TISSUES: The soft tissues are unremarkable. IMPRESSION: 1. No acute bony abnormalities. Electronically signed by: Elsie Gravely MD 10/31/2024 07:08 PM EST RP Workstation: [id number]   DG Chest Port 1 View Result Date: 10/31/2024 EXAM: 1 VIEW(S) XRAY OF THE CHEST 10/31/2024 06:35:00 PM COMPARISON: Comparison with 09/26/2024. CLINICAL HISTORY: Trauma due to a fall. FINDINGS: LUNGS AND PLEURA: Pulmonary vascularity is normal. Lungs are clear. No pleural effusion. No pneumothorax. HEART AND MEDIASTINUM: Heart size is normal. Mediastinal contours appear intact. BONES AND SOFT TISSUES: Old resection or resorption of the distal right clavicle. No change since prior study. IMPRESSION: 1. No acute cardiopulmonary pathology. 2. 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 Electronically signed by: Elsie Gravely MD 10/31/2024 07:07 PM EST RP Workstation: [id number]     Procedures   Medications Ordered in the ED  lactated ringers  bolus 1,000 mL (has no administration in time range)  Medical Decision Making This patient presents to the ED for concern of fall, this involves an extensive number of treatment options, and is a complaint that carries with it a high risk of complications and morbidity.  The differential diagnosis includes mechanical fall, substance abuse, alcohol abuse, seizure, fraction/dislocation/contusion/concussion, intrathoracic vs intraabdominal injury    Co morbidities that complicate the patient evaluation  Schizophrenia,  substance-induced mood disorder, alcohol abuse   Additional history obtained:  Additional history obtained from record review External records from outside source obtained and reviewed including recent [facility] note   Lab Tests:  I Ordered, and personally interpreted labs.  The pertinent results include: CBC within normal limits.  I-STAT Chem-8 within normal limits, CMP CMP unremarkable.  Lactic normal, 1.7.  UDS positive for cocaine/amphetamine/THC/fentanyl . UA with protein and rare bacteria, negative nitrites/leukocytes, low concern for urinary tract infection but will send for culture.  [name] level undetectable.   Imaging Studies ordered:  I ordered imaging studies including  I independently visualized and interpreted imaging which showed - CXR: 1. No acute cardiopulmonary pathology. 2. 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  - pelvic XR: 1. No evidence of acute traumatic injury. - R hand XR: 1. No acute fracture or dislocation. 2. Bandaging over the 4th and 5th fingers limits evaluation. 3. No radiopaque soft tissue foreign body is identified. - L hand XR: 1. No acute bony abnormalities. - XR R shoulder: 1. No acute fracture or dislocation of the right shoulder. 2. Old deformity with callus formation of the distal right clavicle, suggesting either old postoperative or posttraumatic changes. - XR L shoulder: No acute osseous abnormality about the left shoulder. - CT head: Negative head CT. No acute intracranial abnormality. - CT maxillofacial: 1. No acute maxillofacial injury. No fracture. 2. Mild ethmoidal and maxillary sinus disease. - CT C-spine: No acute traumatic injury within the cervical spine. - CT chest/abdomen/pelvis: 1. No evidence of acute traumatic injury. Evaluation limited by motion artifact. 2. Old Deformity of the distal right clavicle. I agree with the radiologist interpretation   Cardiac Monitoring: / EKG:  The  patient was maintained on a cardiac monitor.  I personally viewed and interpreted the cardiac monitored which showed an underlying rhythm of: NSR  Problem List / ED Course / Critical interventions / Medication management  I ordered medication including LR bolus for borderline hypotension Reevaluation of the patient after these medicines showed that the patient resolved I have reviewed the patients home medicines and have made adjustments as needed   Social Determinants of Health:  Tobacco use   Test / Admission - Considered:  Physical exam notable as above, patient complains of pain primarily to his right hand and shoulders bilaterally, no appreciable bony deformity, range of motion is largely intact as above however passive cross body reach at the left shoulder does elicit some discomfort.  Superficial abrasions noted to right hand which is patient's main source of pain, full range of motion of the affected fingers, not actively bleeding. R hand abrasions irrigated thoroughly with normal saline by myself at the bedside, abrasions appear to resemble road rash and are too superficial to necessitate further repair, re-wrapped by nursing staff with nonadherent dressing.  BP was borderline low in triage, LR bolus administered and vitals have since normalized, no fever or tachycardia. Patient is alert and responsive with me, although at times he is slow to respond.  He denies alcohol or substance use today, although bystanders report patient  was smoking something before falling down the steps.  UDS is positive for several substances as above, ethanol level undetectable.  Patient is not demonstrating any signs that would raise concern for alcohol withdrawal.  Patient does have a history of seizure noted in his chart, is not on antiepileptic medications, patient is unable to give me any details on his seizure history but does not appear postictal at this time, has not had any seizure-like activity  since presenting to the emergency department today. X-ray imaging and CT imaging are reassuring as above, no acute findings as it relates to his fall today.  C-collar removed, patient demonstrating full range of motion of the neck without pain or stiffness, patient has been able to eat and tolerate p.o. without difficulty.  Patient does have some abrasions as noted above, I recommend that he continue to apply antibiotic ointment to these and keep them covered to allow for continued healing.  I recommend that he follow-up with his PCP as scheduled, return precautions discussed, he is appropriate for discharge at this time.     Amount and/or Complexity of Data Reviewed Labs: ordered. Radiology: ordered.        Final diagnoses:  Fall, initial encounter    ED Discharge Orders     None          Glendia Rocky LOISE DEVONNA 10/31/24 2018    Ula Prentice SAUNDERS, MD 10/31/24 2256  "

## 2024-10-31 NOTE — ED Triage Notes (Signed)
 PT BIB EMS from long term hotel. Pt was found down on ground for unknown time. unresponsive initially and then became responsive to painful stimuli. Unknown down time. Pt was near a set of steps and could have potentially fallen down steps but not witnessed. Pt states he has had seizures in past. Pt has blood on fingers and blood on face. Fire department was first on scene and told EMS that some people came by who had seen patient and when questions were asked ran off. One witness stated to think they saw pt smoke something. All information obtained by EMS was from different bystanders and unsure the accuracy of the information. PT states he was the wind was blowing and he fell to the ground. Denies falling down any steps. Endorses right hand pain. Denies any other pain. Pt has c collar on from EMS and request that it be removed. Denies any head or neck pain.   110/60 Cbg 200 Hr 115

## 2024-10-31 NOTE — ED Provider Triage Note (Signed)
 Emergency Medicine Provider Triage Evaluation Note  William Mayer , a 34 y.o. male  was evaluated in triage.  Pt complains of fall.  Review of Systems  Positive: Hand wounds, LOC, nausea, confusion Negative: Chest pain, abdominal pain, back pain  Physical Exam  BP (!) 92/48 (BP Location: Right Arm)   Pulse 69   Temp 98.5 F (36.9 C) (Oral)   Resp 16   SpO2 98%  Gen:   Awake, no distress   Resp:  Normal effort  MSK:   Moves extremities without difficulty  Other:  Disoriented  Medical Decision Making  Medically screening exam initiated at 5:12 PM.  Appropriate orders placed.  William Mayer was informed that the remainder of the evaluation will be completed by another provider, this initial triage assessment does not replace that evaluation, and the importance of remaining in the ED until their evaluation is complete.  Patient arrives via EMS for suspected fall.  History is extremely limited.  He was reportedly witnessed smoking something.  He was then found at the bottom of approximately 10 steps unconscious.  He was arousable to painful stimuli and has since awakened.  He remains confused.   Melvenia Motto, MD 10/31/24 [phone]

## 2024-10-31 NOTE — Discharge Instructions (Signed)
 Your CT scans and x-rays today do not show any evidence of fractures or dislocations or acute injuries as a result of your fall today.  You do have some superficial abrasions noted to your knuckles after the fall, please continue to apply antibiotic ointment to these areas and keep them covered to allow for continued healing.  Return to the emergency department if your symptoms worsen, follow-up with your PCP as scheduled.

## 2024-10-31 NOTE — ED Triage Notes (Signed)
 Pt requesting food. Advised he must wait until providers sees results and gives permission to eat

## 2024-10-31 NOTE — ED Triage Notes (Signed)
 Pt states he has had 2 beers today. Denies any drug use

## 2024-10-31 NOTE — ED Notes (Signed)
 Patient is resting comfortably.

## 2024-11-01 LAB — URINE CULTURE: Culture: NO GROWTH

## 2024-11-24 ENCOUNTER — Ambulatory Visit (HOSPITAL_COMMUNITY): Payer: MEDICAID

## 2024-11-24 ENCOUNTER — Ambulatory Visit (INDEPENDENT_AMBULATORY_CARE_PROVIDER_SITE_OTHER): Payer: MEDICAID

## 2024-11-24 ENCOUNTER — Encounter (HOSPITAL_COMMUNITY): Payer: Self-pay

## 2024-11-24 VITALS — BP 109/61 | HR 51 | Wt 142.0 lb

## 2024-11-24 DIAGNOSIS — F200 Paranoid schizophrenia: Secondary | ICD-10-CM

## 2024-11-24 DIAGNOSIS — F411 Generalized anxiety disorder: Secondary | ICD-10-CM

## 2024-11-24 NOTE — Progress Notes (Unsigned)
 Pt presented for Injection of Abilify  400 mg   in RIGHT deltoid. Pt tolerated injection well. Pt denies SI/HI/AVH. No additional concerns. Pt will return in 28 days.

## 2024-12-22 ENCOUNTER — Ambulatory Visit (HOSPITAL_COMMUNITY): Payer: MEDICAID

## 2024-12-31 ENCOUNTER — Encounter (HOSPITAL_COMMUNITY): Payer: MEDICAID
